# Patient Record
Sex: Male | Born: 1985 | Hispanic: No | Marital: Married | Smoking: Former smoker
Health system: Southern US, Community
[De-identification: ages and names within clinical notes are randomized; demographics above are authoritative.]

## PROBLEM LIST (undated history)

## (undated) ENCOUNTER — Ambulatory Visit (HOSPITAL_COMMUNITY): Admission: EM | Payer: Medicaid Other

## (undated) DIAGNOSIS — I10 Essential (primary) hypertension: Secondary | ICD-10-CM

## (undated) DIAGNOSIS — K219 Gastro-esophageal reflux disease without esophagitis: Secondary | ICD-10-CM

## (undated) HISTORY — DX: Gastro-esophageal reflux disease without esophagitis: K21.9

## (undated) HISTORY — DX: Essential (primary) hypertension: I10

## (undated) HISTORY — PX: UPPER GASTROINTESTINAL ENDOSCOPY: SHX188

---

## 2020-06-13 ENCOUNTER — Ambulatory Visit: Payer: Self-pay | Admitting: Nurse Practitioner

## 2020-07-22 ENCOUNTER — Encounter (HOSPITAL_COMMUNITY): Payer: Self-pay

## 2020-07-22 ENCOUNTER — Telehealth (HOSPITAL_COMMUNITY): Payer: Self-pay | Admitting: Emergency Medicine

## 2020-07-22 ENCOUNTER — Ambulatory Visit (HOSPITAL_COMMUNITY)
Admission: EM | Admit: 2020-07-22 | Discharge: 2020-07-22 | Disposition: A | Discharge status: Home / Self Care | Payer: Medicaid Other | Attending: Physician Assistant | Admitting: Physician Assistant

## 2020-07-22 DIAGNOSIS — M545 Low back pain, unspecified: Secondary | ICD-10-CM

## 2020-07-22 MED ORDER — IBUPROFEN 800 MG PO TABS: 800.0000 mg | ORAL_TABLET | Freq: Three times a day (TID) | ORAL | 0 refills | Status: DC | PRN

## 2020-07-22 MED ORDER — METHOCARBAMOL 500 MG PO TABS: 500.0000 mg | ORAL_TABLET | Freq: Four times a day (QID) | ORAL | 0 refills | Status: DC

## 2020-07-22 NOTE — ED Triage Notes (Signed)
Pt c/o severe back pain that radiates from neck to back, started for a long time, intermittent. Pain has gotten severe over time and makes it hard for patient to sleep. Reports no falls and no interventions taken.    Pain score 8/10, feels like "someone is cutting through my back".

## 2020-07-22 NOTE — Discharge Instructions (Addendum)
Schedule to see primary care for evaluation

## 2020-07-22 NOTE — ED Provider Notes (Signed)
[facility]    CSN: [id number] Arrival date & time: 07/22/20  2595      History   Chief Complaint Chief Complaint  Patient presents with   Back Pain   Neck Pain    HPI Shawn Stark is a 35 y.o. male.   The history is provided by the patient. No language interpreter was used.  Back Pain Location:  Generalized Quality:  Aching Radiates to:  Does not radiate Pain severity:  Moderate Onset quality:  Gradual Duration:  36 months (years) Timing:  Constant Progression:  Unchanged Context: not falling and not lifting heavy objects   Relieved by:  Nothing Worsened by:  Nothing Ineffective treatments:  None tried Associated symptoms: no abdominal pain and no numbness   Neck Pain Associated symptoms: no numbness    History reviewed. No pertinent past medical history.  There are no problems to display for this patient.   History reviewed. No pertinent surgical history.     Home Medications    Prior to Admission medications   Not on File    Family History Family History  Problem Relation Age of Onset   Healthy Mother    Healthy Father     Social History Social History   Tobacco Use   Smoking status: Every Day    Pack years: 0.00    Types: Cigarettes  Vaping Use   Vaping Use: Never used  Substance Use Topics   Alcohol use: Never   Drug use: Never     Allergies   Patient has no known allergies.   Review of Systems Review of Systems  Gastrointestinal:  Negative for abdominal pain.  Musculoskeletal:  Positive for back pain and neck pain.  Neurological:  Negative for numbness.  All other systems reviewed and are negative.   Physical Exam Triage Vital Signs ED Triage Vitals  Enc Vitals Group     BP 07/22/20 0853 (!) 132/94     Pulse Rate 07/22/20 0853 74     Resp 07/22/20 0853 18     Temp 07/22/20 0853 99.5 F (37.5 C)     Temp Source 07/22/20 0853 Oral     SpO2 07/22/20 0853 99 %     Weight --      Height --      Head  Circumference --      Peak Flow --      Pain Score 07/22/20 0850 8     Pain Loc --      Pain Edu? --      Excl. in GC? --    No data found.  Updated Vital Signs BP (!) 132/94 (BP Location: Left Arm)   Pulse 74   Temp 99.5 F (37.5 C) (Oral)   Resp 18   SpO2 99%   Visual Acuity Right Eye Distance:   Left Eye Distance:   Bilateral Distance:    Right Eye Near:   Left Eye Near:    Bilateral Near:     Physical Exam Vitals and nursing note reviewed.  Constitutional:      Appearance: He is well-developed.  HENT:     Head: Normocephalic and atraumatic.  Eyes:     Conjunctiva/sclera: Conjunctivae normal.  Cardiovascular:     Rate and Rhythm: Normal rate and regular rhythm.     Heart sounds: No murmur heard. Pulmonary:     Effort: Pulmonary effort is normal. No respiratory distress.     Breath sounds: Normal breath sounds.  Abdominal:  Palpations: Abdomen is soft.     Tenderness: There is no abdominal tenderness.  Musculoskeletal:        General: Normal range of motion.     Cervical back: Neck supple.  Skin:    General: Skin is warm and dry.  Neurological:     General: No focal deficit present.     Mental Status: He is alert and oriented to person, place, and time.     UC Treatments / Results  Labs (all labs ordered are listed, but only abnormal results are displayed) Labs Reviewed - No data to display  EKG   Radiology No results found.  Procedures Procedures (including critical care time)  Medications Ordered in UC Medications - No data to display  Initial Impression / Assessment and Plan / UC Course  I have reviewed the triage vital signs and the nursing notes.  Pertinent labs & imaging results that were available during my care of the patient were reviewed by me and considered in my medical decision making (see chart for details).     MDM;  Pt given rx for ibuprofen and robaxin  Pt given exercises.  Pt advised to follow up with primary care   Final Clinical Impressions(s) / UC Diagnoses   Final diagnoses:  Acute low back pain without sciatica, unspecified back pain laterality     Discharge Instructions      Schedule to see primary care for evaluation     ED Prescriptions     Medication Sig Dispense Auth. Provider   methocarbamol (ROBAXIN) 500 MG tablet Take 1 tablet (500 mg total) by mouth 4 (four) times daily. 20 tablet Jianni Shelden K, [place]   ibuprofen (ADVIL) 800 MG tablet Take 1 tablet (800 mg total) by mouth every 8 (eight) hours as needed. 30 tablet Elson Areas, [place]     An After Visit Summary was printed and given to the patient.  PDMP not reviewed this encounter.   Elson Areas, [place] 07/22/20 [phone]

## 2020-07-22 NOTE — Telephone Encounter (Signed)
Tried contacting pt via phone, unable to communicate due to language barrier. Pt left before registration.

## 2020-08-05 DIAGNOSIS — Z0289 Encounter for other administrative examinations: Secondary | ICD-10-CM

## 2020-08-05 HISTORY — DX: Encounter for other administrative examinations: Z02.89

## 2020-08-09 ENCOUNTER — Ambulatory Visit (HOSPITAL_COMMUNITY)
Admission: RE | Admit: 2020-08-09 | Discharge: 2020-08-09 | Disposition: A | Discharge status: Home / Self Care | Payer: Medicaid Other | Source: Ambulatory Visit | Attending: Family Medicine | Admitting: Family Medicine

## 2020-08-09 ENCOUNTER — Other Ambulatory Visit: Payer: Self-pay

## 2020-08-09 ENCOUNTER — Ambulatory Visit (INDEPENDENT_AMBULATORY_CARE_PROVIDER_SITE_OTHER): Payer: Medicaid Other | Admitting: Family Medicine

## 2020-08-09 ENCOUNTER — Other Ambulatory Visit (HOSPITAL_COMMUNITY)
Admission: RE | Admit: 2020-08-09 | Discharge: 2020-08-09 | Disposition: A | Discharge status: Home / Self Care | Payer: Medicaid Other | Source: Ambulatory Visit | Attending: Family Medicine | Admitting: Family Medicine

## 2020-08-09 VITALS — BP 118/84 | HR 76 | Ht 72.44 in | Wt 179.8 lb

## 2020-08-09 DIAGNOSIS — Z0289 Encounter for other administrative examinations: Secondary | ICD-10-CM | POA: Diagnosis not present

## 2020-08-09 DIAGNOSIS — Z87891 Personal history of nicotine dependence: Secondary | ICD-10-CM

## 2020-08-09 DIAGNOSIS — F17200 Nicotine dependence, unspecified, uncomplicated: Secondary | ICD-10-CM

## 2020-08-09 DIAGNOSIS — G8929 Other chronic pain: Secondary | ICD-10-CM

## 2020-08-09 DIAGNOSIS — M549 Dorsalgia, unspecified: Secondary | ICD-10-CM | POA: Insufficient documentation

## 2020-08-09 DIAGNOSIS — R0789 Other chest pain: Secondary | ICD-10-CM | POA: Insufficient documentation

## 2020-08-09 DIAGNOSIS — M545 Low back pain, unspecified: Secondary | ICD-10-CM | POA: Diagnosis not present

## 2020-08-09 DIAGNOSIS — B353 Tinea pedis: Secondary | ICD-10-CM | POA: Diagnosis not present

## 2020-08-09 DIAGNOSIS — K5900 Constipation, unspecified: Secondary | ICD-10-CM

## 2020-08-09 HISTORY — DX: Tinea pedis: B35.3

## 2020-08-09 HISTORY — DX: Constipation, unspecified: K59.00

## 2020-08-09 HISTORY — DX: Personal history of nicotine dependence: Z87.891

## 2020-08-09 LAB — POCT URINALYSIS DIP (MANUAL ENTRY)
Bilirubin, UA: NEGATIVE
Glucose, UA: NEGATIVE mg/dL
Ketones, POC UA: NEGATIVE mg/dL
Leukocytes, UA: NEGATIVE
Nitrite, UA: NEGATIVE
Protein Ur, POC: NEGATIVE mg/dL
Spec Grav, UA: 1.025 (ref 1.010–1.025)
Urobilinogen, UA: 0.2 E.U./dL
pH, UA: 6.5 (ref 5.0–8.0)

## 2020-08-09 MED ORDER — POLYETHYLENE GLYCOL 3350 17 GM/SCOOP PO POWD
17.0000 g | Freq: Every day | ORAL | 1 refills | Status: DC | PRN
Start: 2020-08-09 — End: 2020-10-04

## 2020-08-09 MED ORDER — NICOTINE 21 MG/24HR TD PT24: 21.0000 mg | MEDICATED_PATCH | Freq: Every day | TRANSDERMAL | 1 refills | Status: DC

## 2020-08-09 MED ORDER — CLOTRIMAZOLE 1 % EX CREA: 1.0000 "application " | TOPICAL_CREAM | Freq: Two times a day (BID) | CUTANEOUS | 0 refills | Status: DC

## 2020-08-09 MED ORDER — ACETAMINOPHEN 500 MG PO TABS: 500.0000 mg | ORAL_TABLET | Freq: Four times a day (QID) | ORAL | 1 refills | Status: DC | PRN

## 2020-08-09 NOTE — Assessment & Plan Note (Signed)
-   discussed importance of cessation, discussed options and he would like to try nicotine patch, prescribed today

## 2020-08-09 NOTE — Assessment & Plan Note (Signed)
-   initial screening lab work completed today - vaccine records and medical history updated today

## 2020-08-09 NOTE — Assessment & Plan Note (Signed)
-   clotrimazole cream to foot twice daily x 4 weeks

## 2020-08-09 NOTE — Assessment & Plan Note (Signed)
-   will get X-ray due to chronicity and subjective history of chills, lab work today as above, PRN apap and the muscle relaxer that he has left from the urgent care for pain, no red flag signs/symptoms

## 2020-08-09 NOTE — Assessment & Plan Note (Signed)
-   PRN Miralax daily, can increase up to two times per day if needed for a soft bowel movement daily

## 2020-08-09 NOTE — Patient Instructions (Addendum)
It was wonderful to see you today.  Please bring ALL of your medications with you to every visit.   Today we talked about:  - Welcome to our clinic! We have scheduled you for follow up in one month with your new PCP Dr Rachael Darby to follow up on the things we discussed today on September 19, 2000 - X-ray lumbar spine has been ordered, you can go across the street to the hospital to get this done, we will call you with the results - We have given you Miralax to take daily for constipation, tylenol as needed for your back pain, nicotine patches once daily to help you quit smoking, and clotrimazole cream for the itching between your toes - We did an EKG today which was normal, and we are scheduling you for an ECHO ultrasound of your heart - the best thing you can do for your heart is to quit smoking!    Thank you for choosing [facility].   Please call [phone] with any questions about today's appointment.  Please be sure to schedule follow up at the front  desk before you leave today.   Burley Saver, MD  Family Medicine

## 2020-08-09 NOTE — Progress Notes (Signed)
Patient Name: Shawn Stark Date of Birth: Feb 14, 1985 Date of Visit: 08/09/20 PCP: Katha Cabal, DO  Chief Complaint: refugee intake examination and intial visit   The patient's preferred language is Arabic. An interpreter was used for the entire visit.  Interpreter Name or ID: Bedor, in person interpretor    Subjective: Shawn Stark is a pleasant 35 y.o. presenting today for an initial refugee and immigrant clinic visit.   Chest pain-he notes a history of pain across his whole chest, sometimes happens twice daily, last occurred 3 days ago.  He notes this has been going on for 3 to 4 years, it will last 3 to 4 hours, and will feel "heavy on my chest," he has not tried any medications for it.  He does smoke 2 to 3 packs of cigarettes per day.  He notes this chest pain will start when he is carrying something heavy.  He notes that he can walk up 4 flights of stairs and can walk a long distance without having any chest pain.  When he has the chest pain he does not have shortness of breath, but sometimes he does feel sweaty.  No other symptoms can be elicited.  Knee pain-he notes for the past 2 to 3 years sometimes when he has pain in his back he will also have pain in his knees.  He denies any injury related to his knees.  He denies the knees swelling are turning red.  He has not tried any medication for his knees.  Low back pain-is to his lumbar spine and notes that the pain has back will sometimes wake him up at night feeling tight.  He notes this has been going on for 5 years.  He denies any inciting injury or accident, notes it is worse with heavy lifting.  He denies any saddle paresthesias, numbness, weakness, fevers.  He notes sometimes his legs feel weak.  He denies any bowel or bladder incontinence.  He went to urgent care a week ago and gave him the ibuprofen and muscle relaxer which she notes helps with the pain.  He denies any fevers but does note "chills" that he has had every day for  the past 1 to 1-1/2 years.  He has not had any testing for this.  Previous elevated blood pressure-on overseas exam blood pressure was noted to be 140/90, today blood pressure is within normal limits.  He denies any history of hypertension.  Denies any vision changes or headaches.  Itching of left toes-has been going on for 6 months, notes itching between the creases.  Has not tried anything for it  Pain on his pinky toe of right foot, has also been going on for 6 months, hurts when he wears tight shoes, no injury to his foot that he is aware of.  No redness or swelling.  ROS: no cough or difficulty breathing, no hemoptysis, no weight loss,  + chest pain after lifting (see above), no abdominal pain, no diarrhea, + constipation, no blood in stool, + subjective chills daily (noticed as shaking when he has pain), no fevers, denies vision changes or headaches, denies trouble with eating, does note some tooth pain  PMH: No past medical history No blood transfusions  PSH: No surgeries   FH: No family history of heart disease, diabetes, high cholesterol  Allergies:  NKDA   Current Medications:  Ibuprofen PRN Robaxin PRN   Social History: Tobacco Use: since age 38, ~ 20 years, 20-25 cigarettes (2-3 boxes), has quit  in the past- quit 1.5 years once Alcohol Use: None  In the past two weeks, have you run out of food before you had money to purchase more? None  In the past two weeks, have you had difficulty with obtaining food for your family? No  Refugee Information Number of Immediate Family Members: 2 Number of Immediate Family Members in [place]: 1 Date of Arrival: 04/28/20 Country of Birth: [place] Country of Origin: Other ([place]) Location of Refugee Camp:  ([place]) Duration in [place]: 6-10 years (9 years) Reason for Leaving Home Country: [place] Language: Arabic Able to Read in Primary Language: Yes Able to Write in Primary Language: No Education: [employer] (8th  grade) Prior Work: Set designer, farmer Marital Status: Married Tuberculosis Screening Overseas: Negative Tuberculosis Screening Health Department: Not Completed Health Department Labs Completed: No History of Trauma: None Do You Feel Jumpy or Nervous?: No Are You Very Watchful or 'Super Alert'?: No   Date of Overseas Exam: 03/13/2020 Review of Overseas Exam: Yes Pre-Departure Treatment: praziquantal, albendazole, ivermectin  [place]verseas Vaccines Reviewed and Updated in Epic Yes   Vitals:   08/09/20 0918  BP: 118/84  Pulse: 76  SpO2: 97%   HEENT: Sclera anicteric. Dentition is poor . Appears well hydrated. Neck: Supple Cardiac: Regular rate and rhythm. Normal S1/S2. No murmurs, rubs, or gallops appreciated. Lungs: Clear bilaterally to ascultation.  Abdomen: Normoactive bowel sounds. No tenderness to deep or light palpation. No rebound or guarding. Splenomegaly not appreciated on exam  Extremities: Warm, well perfused without edema.  Skin: scaling of skin of left foot in between toe folds, no bleeding or skin breakdown, thickened pinky toenail on right foot without skin breakdown, + callus on right lateral foot without skin breakdown  Psych: Pleasant and appropriate  MSK: strength 5/5 in bilateral lower extremities, sensation intact bilaterally, patellar DTRs 2+ bilaterally, full forward flexion of spine noted although he notes some pain with doing this  EKG read and interpreted by myself: NSR, no ST segment changes or TWI, incomplete RBBB, no Q waves, no ectopy  Constipation - PRN Miralax daily, can increase up to two times per day if needed for a soft bowel movement daily  Chronic midline low back pain without sciatica - will get X-ray due to chronicity and subjective history of chills, lab work today as above, PRN apap and the muscle relaxer that he has left from the urgent care for pain, no red flag signs/symptoms  Encounter for health examination of refugee - initial  screening lab work completed today - vaccine records and medical history updated today  Tobacco use disorder - discussed importance of cessation, discussed options and he would like to try nicotine patch, prescribed today  Atypical chest pain - this is noted mostly with lifting, he notes he can walk up four flights of stairs and walk a long distance without chest pain, thus making me much less suspicious for angina - EKG in office today without signs of ischemia - risk stratification labs including lipid panel and A1c ordered today, discussed smoking cessation as above - ECHO ordered to assess for structural heart defects, no murmur or abnormality appreciated on physical exam today - Discussed return precautions with patient, consider cardiology referral for stress test if pain changes or abnormalities noted on ECHO  Tinea pedis of left foot - clotrimazole cream to foot twice daily x 4 weeks  At follow up- - At end of visit mentioned occassional heartburn, consider H pylori testing and trial of H2  blocker or PPI  I have personally updated the history tabs within Epic and included the refugee information in social documentation.   Designated Market researcher signed with agency.   Release of information signed for Health Department.   Return to care in 1 month in [facility] with resident physician and PCP Dr Rachael Darby.   Vaccines: Updated in Epic

## 2020-08-09 NOTE — Assessment & Plan Note (Signed)
-   this is noted mostly with lifting, he notes he can walk up four flights of stairs and walk a long distance without chest pain, thus making me much less suspicious for angina - EKG in office today without signs of ischemia - risk stratification labs including lipid panel and A1c ordered today, discussed smoking cessation as above - ECHO ordered to assess for structural heart defects, no murmur or abnormality appreciated on physical exam today - Discussed return precautions with patient, consider cardiology referral for stress test if pain changes or abnormalities noted on ECHO

## 2020-08-10 LAB — SEDIMENTATION RATE: Sed Rate: 2 mm/hr (ref 0–15)

## 2020-08-10 LAB — URINE CYTOLOGY ANCILLARY ONLY
Chlamydia: NEGATIVE
Comment: NEGATIVE
Comment: NORMAL
Neisseria Gonorrhea: NEGATIVE

## 2020-08-10 LAB — HEMOGLOBIN A1C
Est. average glucose Bld gHb Est-mCnc: 100 mg/dL
Hgb A1c MFr Bld: 5.1 % (ref 4.8–5.6)

## 2020-08-14 ENCOUNTER — Telehealth: Payer: Self-pay | Admitting: Family Medicine

## 2020-08-14 DIAGNOSIS — R7612 Nonspecific reaction to cell mediated immunity measurement of gamma interferon antigen response without active tuberculosis: Secondary | ICD-10-CM

## 2020-08-14 LAB — QUANTIFERON-TB GOLD PLUS
QuantiFERON Mitogen Value: >10 IU/mL
QuantiFERON Nil Value: 0.49 IU/mL
QuantiFERON TB1 Ag Value: 1.89 IU/mL
QuantiFERON TB2 Ag Value: 3.48 IU/mL
QuantiFERON-TB Gold Plus: POSITIVE — AB

## 2020-08-14 LAB — HEPATITIS B SURFACE ANTIGEN: Hepatitis B Surface Ag: NEGATIVE

## 2020-08-14 LAB — CBC WITH DIFFERENTIAL/PLATELET
Basophils Absolute: 0 10*3/uL (ref 0.0–0.2)
Basos: 1 %
EOS (ABSOLUTE): 0.1 10*3/uL (ref 0.0–0.4)
Eos: 1 %
Hematocrit: 48.4 % (ref 37.5–51.0)
Hemoglobin: 16.3 g/dL (ref 13.0–17.7)
Immature Grans (Abs): 0 10*3/uL (ref 0.0–0.1)
Immature Granulocytes: 0 %
Lymphocytes Absolute: 2 10*3/uL (ref 0.7–3.1)
Lymphs: 47 %
MCH: 29.6 pg (ref 26.6–33.0)
MCHC: 33.7 g/dL (ref 31.5–35.7)
MCV: 88 fL (ref 79–97)
Monocytes Absolute: 0.4 10*3/uL (ref 0.1–0.9)
Monocytes: 9 %
Neutrophils Absolute: 1.8 10*3/uL (ref 1.4–7.0)
Neutrophils: 42 %
Platelets: 162 10*3/uL (ref 150–450)
RBC: 5.51 x10E6/uL (ref 4.14–5.80)
RDW: 13.9 % (ref 11.6–15.4)
WBC: 4.2 10*3/uL (ref 3.4–10.8)

## 2020-08-14 LAB — COMPREHENSIVE METABOLIC PANEL
ALT: 34 IU/L (ref 0–44)
AST: 24 IU/L (ref 0–40)
Albumin/Globulin Ratio: 1.8 (ref 1.2–2.2)
Albumin: 4.6 g/dL (ref 4.0–5.0)
Alkaline Phosphatase: 45 IU/L (ref 44–121)
BUN/Creatinine Ratio: 10 (ref 9–20)
BUN: 9 mg/dL (ref 6–20)
Bilirubin Total: 0.5 mg/dL (ref 0.0–1.2)
CO2: 23 mmol/L (ref 20–29)
Calcium: 9.9 mg/dL (ref 8.7–10.2)
Chloride: 100 mmol/L (ref 96–106)
Creatinine, Ser: 0.93 mg/dL (ref 0.76–1.27)
Globulin, Total: 2.5 g/dL (ref 1.5–4.5)
Glucose: 96 mg/dL (ref 65–99)
Potassium: 4.2 mmol/L (ref 3.5–5.2)
Sodium: 137 mmol/L (ref 134–144)
Total Protein: 7.1 g/dL (ref 6.0–8.5)
eGFR: 111 mL/min/{1.73_m2} (ref >59)

## 2020-08-14 LAB — LIPID PANEL
Chol/HDL Ratio: 6.9 ratio — ABNORMAL HIGH (ref 0.0–5.0)
Cholesterol, Total: 193 mg/dL (ref 100–199)
HDL: 28 mg/dL — ABNORMAL LOW (ref >39)
LDL Chol Calc (NIH): 81 mg/dL (ref 0–99)
Triglycerides: 519 mg/dL — ABNORMAL HIGH (ref 0–149)
VLDL Cholesterol Cal: 84 mg/dL — ABNORMAL HIGH (ref 5–40)

## 2020-08-14 LAB — HCV INTERPRETATION

## 2020-08-14 LAB — TSH: TSH: 1.5 u[IU]/mL (ref 0.450–4.500)

## 2020-08-14 LAB — HCV AB W REFLEX TO QUANT PCR: HCV Ab: 0.2 s/co ratio (ref 0.0–0.9)

## 2020-08-14 LAB — HIV ANTIBODY (ROUTINE TESTING W REFLEX): HIV Screen 4th Generation wRfx: NONREACTIVE

## 2020-08-14 LAB — RPR: RPR Ser Ql: NONREACTIVE

## 2020-08-14 LAB — HEPATITIS B CORE ANTIBODY, TOTAL: Hep B Core Total Ab: NEGATIVE

## 2020-08-14 LAB — VARICELLA ZOSTER ANTIBODY, IGG: Varicella zoster IgG: 2394 index (ref >165)

## 2020-08-14 LAB — HEPATITIS B SURFACE ANTIBODY, QUANTITATIVE: Hepatitis B Surf Ab Quant: 21.4 m[IU]/mL (ref >9.9)

## 2020-08-14 NOTE — Telephone Encounter (Signed)
Called with Arabic interpretor ID: [id number]. Confirmed I am speaking with Shawn Stark.   Explained that lab results normal except for Quant Gold which was positive. He denies fevers, chills, night sweats, dyspnea or coughing. Explained that we need to do a Chest X-ray to rule out pulmonary abnormality and then he can be treated for latent TB at the health department. Explained the difference between latent and pulmonary TB.  Explained that he would go to [facility] to get the chest X-ray, same place where he got his back X-ray. Chest X-ray ordered. Explained I will call him with the results.  Also explained lumbar spine X-ray without fracture or signs of arthritis.   Answered all questions and concerns.  Burley Saver MD

## 2020-08-18 ENCOUNTER — Other Ambulatory Visit: Payer: Self-pay

## 2020-08-18 ENCOUNTER — Other Ambulatory Visit: Payer: Self-pay | Admitting: Obstetrics and Gynecology

## 2020-08-18 ENCOUNTER — Ambulatory Visit
Admission: RE | Admit: 2020-08-18 | Discharge: 2020-08-18 | Disposition: A | Discharge status: Home / Self Care | Payer: No Typology Code available for payment source | Source: Ambulatory Visit | Attending: Obstetrics and Gynecology | Admitting: Obstetrics and Gynecology

## 2020-08-18 DIAGNOSIS — R7611 Nonspecific reaction to tuberculin skin test without active tuberculosis: Secondary | ICD-10-CM

## 2020-08-22 ENCOUNTER — Other Ambulatory Visit: Payer: Self-pay

## 2020-08-22 ENCOUNTER — Ambulatory Visit (HOSPITAL_COMMUNITY)
Admission: RE | Admit: 2020-08-22 | Discharge: 2020-08-22 | Disposition: A | Discharge status: Home / Self Care | Payer: Medicaid Other | Source: Ambulatory Visit | Attending: Family Medicine | Admitting: Family Medicine

## 2020-08-22 DIAGNOSIS — R0789 Other chest pain: Secondary | ICD-10-CM | POA: Insufficient documentation

## 2020-08-22 LAB — ECHOCARDIOGRAM COMPLETE
Area-P 1/2: 4.8 cm2
S' Lateral: 3 cm

## 2020-08-22 NOTE — Progress Notes (Signed)
  Echocardiogram 2D Echocardiogram has been performed.  Delcie Roch 08/22/2020, 2:35 PM

## 2020-09-19 ENCOUNTER — Ambulatory Visit: Payer: Self-pay | Admitting: Family Medicine

## 2020-09-20 ENCOUNTER — Emergency Department (HOSPITAL_COMMUNITY): Payer: Medicaid Other

## 2020-09-20 ENCOUNTER — Emergency Department (HOSPITAL_COMMUNITY)
Admission: EM | Admit: 2020-09-20 | Discharge: 2020-09-20 | Disposition: A | Discharge status: Home / Self Care | Payer: Medicaid Other | Attending: Emergency Medicine | Admitting: Emergency Medicine

## 2020-09-20 ENCOUNTER — Encounter (HOSPITAL_COMMUNITY): Payer: Self-pay | Admitting: Emergency Medicine

## 2020-09-20 DIAGNOSIS — R110 Nausea: Secondary | ICD-10-CM | POA: Insufficient documentation

## 2020-09-20 DIAGNOSIS — R1010 Upper abdominal pain, unspecified: Secondary | ICD-10-CM | POA: Diagnosis not present

## 2020-09-20 DIAGNOSIS — R109 Unspecified abdominal pain: Secondary | ICD-10-CM

## 2020-09-20 DIAGNOSIS — F17210 Nicotine dependence, cigarettes, uncomplicated: Secondary | ICD-10-CM | POA: Diagnosis not present

## 2020-09-20 LAB — COMPREHENSIVE METABOLIC PANEL
ALT: 30 U/L (ref 0–44)
AST: 25 U/L (ref 15–41)
Albumin: 4 g/dL (ref 3.5–5.0)
Alkaline Phosphatase: 35 U/L — ABNORMAL LOW (ref 38–126)
Anion gap: 8 (ref 5–15)
BUN: 16 mg/dL (ref 6–20)
CO2: 23 mmol/L (ref 22–32)
Calcium: 9.4 mg/dL (ref 8.9–10.3)
Chloride: 106 mmol/L (ref 98–111)
Creatinine, Ser: 0.92 mg/dL (ref 0.61–1.24)
GFR, Estimated: >60 mL/min (ref >60)
Glucose, Bld: 86 mg/dL (ref 70–99)
Potassium: 3.9 mmol/L (ref 3.5–5.1)
Sodium: 137 mmol/L (ref 135–145)
Total Bilirubin: 0.8 mg/dL (ref 0.3–1.2)
Total Protein: 6.9 g/dL (ref 6.5–8.1)

## 2020-09-20 LAB — CBC WITH DIFFERENTIAL/PLATELET
Abs Immature Granulocytes: 0.02 10*3/uL (ref 0.00–0.07)
Basophils Absolute: 0 10*3/uL (ref 0.0–0.1)
Basophils Relative: 1 %
Eosinophils Absolute: 0.1 10*3/uL (ref 0.0–0.5)
Eosinophils Relative: 1 %
HCT: 45.4 % (ref 39.0–52.0)
Hemoglobin: 16 g/dL (ref 13.0–17.0)
Immature Granulocytes: 1 %
Lymphocytes Relative: 46 %
Lymphs Abs: 2.1 10*3/uL (ref 0.7–4.0)
MCH: 29.9 pg (ref 26.0–34.0)
MCHC: 35.2 g/dL (ref 30.0–36.0)
MCV: 84.9 fL (ref 80.0–100.0)
Monocytes Absolute: 0.4 10*3/uL (ref 0.1–1.0)
Monocytes Relative: 10 %
Neutro Abs: 1.8 10*3/uL (ref 1.7–7.7)
Neutrophils Relative %: 41 %
Platelets: 169 10*3/uL (ref 150–400)
RBC: 5.35 MIL/uL (ref 4.22–5.81)
RDW: 12.3 % (ref 11.5–15.5)
WBC: 4.3 10*3/uL (ref 4.0–10.5)
nRBC: 0 % (ref 0.0–0.2)

## 2020-09-20 LAB — LIPASE, BLOOD: Lipase: 30 U/L (ref 11–51)

## 2020-09-20 LAB — TROPONIN I (HIGH SENSITIVITY)
Troponin I (High Sensitivity): 3 ng/L (ref <18)
Troponin I (High Sensitivity): 3 ng/L (ref <18)

## 2020-09-20 MED ORDER — PANTOPRAZOLE SODIUM 20 MG PO TBEC: 20.0000 mg | DELAYED_RELEASE_TABLET | Freq: Every day | ORAL | 0 refills | Status: DC

## 2020-09-20 MED ORDER — FAMOTIDINE IN NACL 20-0.9 MG/50ML-% IV SOLN
20.0000 mg | Freq: Once | INTRAVENOUS | Status: AC
  Administered 2020-09-20: 20 mg via INTRAVENOUS
  Filled 2020-09-20: qty 50

## 2020-09-20 MED ORDER — LIDOCAINE VISCOUS HCL 2 % MT SOLN
15.0000 mL | Freq: Once | OROMUCOSAL | Status: AC
  Administered 2020-09-20: 15 mL via ORAL
  Filled 2020-09-20: qty 15

## 2020-09-20 MED ORDER — ONDANSETRON HCL 4 MG/2ML IJ SOLN: 4.0000 mg | Freq: Once | INTRAMUSCULAR | Status: DC

## 2020-09-20 MED ORDER — ONDANSETRON 4 MG PO TBDP: 4.0000 mg | ORAL_TABLET | Freq: Three times a day (TID) | ORAL | 0 refills | Status: DC | PRN

## 2020-09-20 MED ORDER — SUCRALFATE 1 GM/10ML PO SUSP: 1.0000 g | Freq: Three times a day (TID) | ORAL | 0 refills | Status: DC

## 2020-09-20 MED ORDER — SODIUM CHLORIDE 0.9 % IV BOLUS
1000.0000 mL | Freq: Once | INTRAVENOUS | Status: AC
  Administered 2020-09-20: 1000 mL via INTRAVENOUS

## 2020-09-20 MED ORDER — ALUM & MAG HYDROXIDE-SIMETH 200-200-20 MG/5ML PO SUSP
30.0000 mL | Freq: Once | ORAL | Status: AC
  Administered 2020-09-20: 30 mL via ORAL
  Filled 2020-09-20: qty 30

## 2020-09-20 NOTE — Discharge Instructions (Addendum)
Your labs and ultrasound were reassuring.  We are sending you home with the following medications to help with your symptoms:  - Protonix- please take 1 tablet in the morning prior to any meals to help with stomach acidity/pain.  - Carafate- please take prior to each meal and prior to bedtime to help with stomach acidity/pain.  - Zofran- please take every 8 hours as needed for nausea/vomiting.   We have prescribed you new medication(s) today. Discuss the medications prescribed today with your pharmacist as they can have adverse effects and interactions with your other medicines including over the counter and prescribed medications. Seek medical evaluation if you start to experience new or abnormal symptoms after taking one of these medicines, seek care immediately if you start to experience difficulty breathing, feeling of your throat closing, facial swelling, or rash as these could be indications of a more serious allergic reaction  Please follow attached diet guidelines.   Follow up with your primary care provider and or GI as soon as possible for re-evaluation..  Return to the ER for new or worsening symptoms including but not limited to worsened pain, new pain, inability to keep fluids down, blood in vomit/stool, passing out, or any other concerns.

## 2020-09-20 NOTE — ED Notes (Signed)
Interpreter used and patient verbalizes understanding of discharge instructions. Prescriptions and follow-up care with the GI reviewed. Opportunity for questioning and answers were provided. Armband removed by staff, pt discharged from ED ambulatory.

## 2020-09-20 NOTE — ED Triage Notes (Signed)
Pt here from home  with c/o acid reflux that has been ongoing for 2 months , pt has not tried anything for it

## 2020-09-20 NOTE — ED Provider Notes (Signed)
[facility] EMERGENCY DEPARTMENT Provider Note   CSN: [id number] Arrival date & time: 09/20/20  3382     History Chief Complaint  Patient presents with   Abdominal Pain    Shawn Stark is a 35 y.o. male with a hx of tobacco use who presents to the ED with complaints of abdominal pain intermittently for years. Patient states he has had intermittent abdominal pain for a very long time that seems to be getting progressively worse, lasts longer periods of times and is occurring more frequently recently. Pain is located in the upper abdomen with some radiating into the chest, feels like a burning/tightness that is most prominent after eating something spicy/greasy, but can occur with drinking water as well. He states that he sometimes has the pain when exerting himself, but this does not specifically seem to trigger it or worsen it. He is able to exercise without the pain. He has associated nausea at times and does not intermittent constipation, passing gas without difficulty. Seen by PCP, had reassuring echo. Not currently taking any  medications for this. Denies fever, chills, hematemesis, melena, hematochezia, hematemesis, dysuria, or dyspnea.   Interpretor utilized throughout Audiological scientist.   HPI     History reviewed. No pertinent past medical history.  Patient Active Problem List   Diagnosis Date Noted   Atypical chest pain 08/09/2020   Chronic midline low back pain without sciatica 08/09/2020   Tinea pedis of left foot 08/09/2020   Tobacco use disorder 08/09/2020   Constipation 08/09/2020   Encounter for health examination of refugee 08/05/2020    History reviewed. No pertinent surgical history.     Family History  Problem Relation Age of Onset   Healthy Mother    Healthy Father     Social History   Tobacco Use   Smoking status: Every Day    Types: Cigarettes   Smokeless tobacco: Never  Vaping Use   Vaping Use: Never used  Substance Use Topics    Alcohol use: Never   Drug use: Never    Home Medications Prior to Admission medications   Medication Sig Start Date End Date Taking? Authorizing Provider  acetaminophen (TYLENOL) 500 MG tablet Take 1 tablet (500 mg total) by mouth every 6 (six) hours as needed. 08/09/20   Billey Co, MD  clotrimazole (LOTRIMIN) 1 % cream Apply 1 application topically 2 (two) times daily. For four weeks total 08/09/20   Billey Co, MD  ibuprofen (ADVIL) 800 MG tablet Take 1 tablet (800 mg total) by mouth every 8 (eight) hours as needed. 07/22/20   Elson Areas, PA-C  methocarbamol (ROBAXIN) 500 MG tablet Take 1 tablet (500 mg total) by mouth 4 (four) times daily. 07/22/20   Elson Areas, PA-C  nicotine (NICODERM CQ - DOSED IN MG/24 HOURS) 21 mg/24hr patch Place 1 patch (21 mg total) onto the skin daily. 08/09/20   Billey Co, MD  polyethylene glycol powder (GLYCOLAX/MIRALAX) 17 GM/SCOOP powder Take 17 g by mouth daily as needed for mild constipation. 08/09/20   Billey Co, MD    Allergies    Patient has no known allergies.  Review of Systems   Review of Systems  Constitutional:  Negative for chills and fever.  Respiratory:  Negative for shortness of breath.   Cardiovascular:  Positive for chest pain. Negative for leg swelling.  Gastrointestinal:  Positive for abdominal pain, constipation and nausea. Negative for anal bleeding, blood in stool, diarrhea and vomiting.  Genitourinary:  Negative for dysuria.  Neurological:  Negative for syncope.  All other systems reviewed and are negative.  Physical Exam Updated Vital Signs BP (!) 153/93   Pulse (!) 58   Temp (!) 97.3 F (36.3 C) (Oral)   Resp 15   SpO2 100%   Physical Exam Vitals and nursing note reviewed.  Constitutional:      General: He is not in acute distress.    Appearance: He is well-developed. He is not toxic-appearing.  HENT:     Head: Normocephalic and atraumatic.  Eyes:     General:        Right eye: No  discharge.        Left eye: No discharge.     Conjunctiva/sclera: Conjunctivae normal.  Cardiovascular:     Rate and Rhythm: Normal rate and regular rhythm.  Pulmonary:     Effort: Pulmonary effort is normal. No respiratory distress.     Breath sounds: Normal breath sounds. No wheezing, rhonchi or rales.  Abdominal:     General: There is no distension.     Palpations: Abdomen is soft.     Tenderness: There is abdominal tenderness in the right upper quadrant, epigastric area and left upper quadrant. There is no guarding or rebound. Negative signs include Murphy's sign and McBurney's sign.  Musculoskeletal:     Cervical back: Neck supple.  Skin:    General: Skin is warm and dry.     Findings: No rash.  Neurological:     Mental Status: He is alert.     Comments: Clear speech.   Psychiatric:        Behavior: Behavior normal.    ED Results / Procedures / Treatments   Labs (all labs ordered are listed, but only abnormal results are displayed) Labs Reviewed  COMPREHENSIVE METABOLIC PANEL - Abnormal; Notable for the following components:      Result Value   Alkaline Phosphatase 35 (*)    All other components within normal limits  LIPASE, BLOOD  CBC WITH DIFFERENTIAL/PLATELET  TROPONIN I (HIGH SENSITIVITY)  TROPONIN I (HIGH SENSITIVITY)    EKG None  Radiology DG Chest 2 View  Result Date: 09/20/2020 CLINICAL DATA:  35 year old male with intermittent epigastric/chest pain for the past 2 months. Postprandial pain. EXAM: CHEST - 2 VIEW COMPARISON:  Chest radiograph 08/18/2020. FINDINGS: Mildly lower lung volumes. Normal cardiac size and mediastinal contours. Visualized tracheal air column is within normal limits. Both lungs appear clear. No pneumothorax or pleural effusion. Negative visible bowel gas and osseous structures. IMPRESSION: Negative.  No cardiopulmonary abnormality. Electronically Signed   By: Odessa Fleming M.D.   On: 09/20/2020 10:21   US Abdomen Limited RUQ  (LIVER/GB)  Result Date: 09/20/2020 CLINICAL DATA:  RIGHT upper quadrant pain for 2 months in a 35 year old male. EXAM: ULTRASOUND ABDOMEN LIMITED RIGHT UPPER QUADRANT COMPARISON:  None. FINDINGS: Gallbladder: No gallstones or wall thickening visualized. No sonographic Murphy sign noted by sonographer. Common bile duct: Diameter: 2.2 mm Liver: No focal lesion identified. Within normal limits in parenchymal echogenicity. Portal vein is patent on color Doppler imaging with normal direction of blood flow towards the liver. Other: None. IMPRESSION: Unremarkable RIGHT upper quadrant sonogram. No signs of acute biliary process. Electronically Signed   By: Donzetta Kohut M.D.   On: 09/20/2020 17:02    Procedures Procedures   Medications Ordered in ED Medications  ondansetron (ZOFRAN) injection 4 mg (4 mg Intravenous Not Given 09/20/20 1729)  sodium chloride 0.9 % bolus  1,000 mL (0 mLs Intravenous Stopped 09/20/20 1955)  famotidine (PEPCID) IVPB 20 mg premix (0 mg Intravenous Stopped 09/20/20 1725)  alum & mag hydroxide-simeth (MAALOX/MYLANTA) 200-200-20 MG/5ML suspension 30 mL (30 mLs Oral Given 09/20/20 1656)    And  lidocaine (XYLOCAINE) 2 % viscous mouth solution 15 mL (15 mLs Oral Given 09/20/20 1656)    ED Course  I have reviewed the triage vital signs and the nursing notes.  Pertinent labs & imaging results that were available during my care of the patient were reviewed by me and considered in my medical decision making (see chart for details).    MDM Rules/Calculators/A&P                           Patient presents to the ED with complaints of abdominal pain intermittently for years. Nontoxic, vitals w/ mildly elevated BP. Mild upper abdominal tenderness on exam without peritoneal signs.   Additional history obtained:  Additional history obtained from chart review & nursing note review.  08/22/20: Echocardiogram: EF 60-65%, normal diastolic parameters.   Lab Tests:  I reviewed and  interpreted labs, which included:  CBC, CMP, lipase, troponin: Unremarkable.   Imaging Studies ordered:  CXR ordered in triage, I ordered RUQ [place], I independently reviewed, formal radiology impression shows:  Unremarkable RIGHT upper quadrant sonogram. No signs of acute biliary process  ED Course:  EKG without acute ischemia, troponins are flat, patient able to exercise without symptoms, feel that ACS is less likely.  Right upper quadrant ultrasound without findings of cholelithiasis, cholecystitis, or choledocholithiasis.  Lipase is within normal limits making pancreatitis less likely.  Repeat abdominal exam remains without peritoneal signs, do not suspect acute surgical abdomen.  Patient feeling improved status post GI cocktail and Pepcid in the emergency department.  Unclear definitive etiology, possibly GERD versus PUD, will provide supportive care with PCP follow-up. I discussed results, treatment plan, need for follow-up, and return precautions with the patient. Provided opportunity for questions, patient confirmed understanding and is in agreement with plan.   Portions of this note were generated with Scientist, clinical (histocompatibility and immunogenetics). Dictation errors may occur despite best attempts at proofreading.  Final Clinical Impression(s) / ED Diagnoses Final diagnoses:  Abdominal pain    Rx / DC Orders ED Discharge Orders          Ordered    pantoprazole (PROTONIX) 20 MG tablet  Daily        09/20/20 2005    sucralfate (CARAFATE) 1 GM/10ML suspension  3 times daily with meals & bedtime        09/20/20 2005    ondansetron (ZOFRAN ODT) 4 MG disintegrating tablet  Every 8 hours PRN        09/20/20 [address] 09/20/20 2007    Gerhard Munch, MD 09/21/20 2349

## 2020-09-20 NOTE — ED Provider Notes (Signed)
Emergency Medicine Provider Triage Evaluation Note  Shawn Stark , a 35 y.o. male  was evaluated in triage.  Pt complains of intermittent epigastric/chest pain for the past 2 months. Pt reports pain is worse after eating and describes it as a burning sensation. + nausea, no SOB or vomiting. Seen by family medicine last month for same however at that point described the pain as worse with exertion/lifting. EKG in office without ischemic changes and outpatient ECHO normal. Pt reports pain is more severe now prompting ED visit. He does not take anything for reflux.   Review of Systems  Positive: + epigastric/chest pain Negative: - SOB, vomiting, diaphoresis  Physical Exam  BP (!) 148/93 (BP Location: Left Arm)   Pulse 61   Temp (!) 97.3 F (36.3 C) (Oral)   Resp 14   SpO2 100%  Gen:   Awake, no distress   Resp:  Normal effort  MSK:   Moves extremities without difficulty  Other:  + epigastric TTP  Medical Decision Making  Medically screening exam initiated at 9:39 AM.  Appropriate orders placed.  Audrea Muscat was informed that the remainder of the evaluation will be completed by another provider, this initial triage assessment does not replace that evaluation, and the importance of remaining in the ED until their evaluation is complete.  Suspect GERD however labs, EKG, and CXR ordered at this time.    Tanda Rockers, PA-C 09/20/20 9528    Rolan Bucco, MD 09/20/20 1101

## 2020-09-29 NOTE — Progress Notes (Signed)
Chief Complaint: Abdominal pain  Due to language barrier, an interpreter was present during the history-taking and subsequent discussion (and for part of the physical exam) with this patient.  HPI :  35 year old [place] male w/hx of tobacco use presents for abdominal pain. The ab pain feels like a 10/10 in severity, is located in the epigastric area, radiates up the chest, worsened over time, and feels like a "cutting sensation." This ab pain has been an ongoing issue for at least the last 2 years. Fatty foods make the pain worse. Sometimes the pain wakes him up at night. Protonix has helped with some of ab pain but does not take it away completely. He takes his Protonix 20 mg after dinner before he goes to bed. He is also taking sucralfate every 3 hours, which helps somewhat with his symptoms. Denies dysphagia. Endorses constipation and black stools. The black stools have been present for a week. Denies iron supplement or Pepto Bismol use. Denies family history of GI cancers or GI diseases. Denies weight loss. On average he is having one stool every 2-3 days. He has been taking Miralax 2-3 times per day to try to induce a bowel movement. Denies fiber supplement use. Denies prior EGD or colonoscopy. Denies alcohol. Smokes 14-15 cigarettes per day.   Patient was seen in ED on 09/20/20 for chest pain/epigastric ab pain that worsens after eating and felt like a burning sensation. Work up including CBC, LFTs, lipase, troponin, RUQ U/S, CXR, and ECG were done at that time that were normal. He was given GI cocktail and Pepcid in the ED, and was prescribed Protonix 20 mg QD, carafate, and Zofran upon discharge. Also recently had TTE performed by PCP on 08/22/20 that was normal.   History reviewed. No pertinent past medical history.   History reviewed. No pertinent surgical history. Family History  Problem Relation Age of Onset   Healthy Mother    Healthy Father    Social History   Tobacco Use    Smoking status: Every Day    Types: Cigarettes   Smokeless tobacco: Never  Vaping Use   Vaping Use: Never used  Substance Use Topics   Alcohol use: Never   Drug use: Never   Current Outpatient Medications  Medication Sig Dispense Refill   ondansetron (ZOFRAN ODT) 4 MG disintegrating tablet Take 1 tablet (4 mg total) by mouth every 8 (eight) hours as needed for nausea or vomiting. 10 tablet 0   pantoprazole (PROTONIX) 20 MG tablet Take 1 tablet (20 mg total) by mouth daily. 30 tablet 0   sucralfate (CARAFATE) 1 GM/10ML suspension Take 10 mLs (1 g total) by mouth 4 (four) times daily -  with meals and at bedtime. 420 mL 0   No current facility-administered medications for this visit.   No Known Allergies   Review of Systems: All systems reviewed and negative except where noted in HPI.   Physical Exam: BP 134/88   Pulse 77   Ht 6\' 4"  (1.93 m)   Wt 182 lb (82.6 kg)   SpO2 98%   BMI 22.15 kg/m  Constitutional: Pleasant,well-developed, male in no acute distress. HEENT: Normocephalic and atraumatic. No scleral icterus. Cardiovascular: Normal rate, regular rhythm.  Pulmonary/chest: Effort normal and breath sounds normal. No wheezing, rales or rhonchi. Abdominal: Soft, nondistended, tender in the epigastric area. Bowel sounds active throughout.  Extremities: No edema Neurological: Alert and oriented to person place and time. Psychiatric: Normal mood and affect. Behavior is normal.  TTE 08/22/20: IMPRESSIONS   1. Left ventricular ejection fraction, by estimation, is 60 to 65%. The  left ventricle has normal function. The left ventricle has no regional  wall motion abnormalities. Left ventricular diastolic parameters were  normal.   2. Right ventricular systolic function is normal. The right ventricular  size is normal.   3. The mitral valve is grossly normal. Trivial mitral valve  regurgitation.   4. The aortic valve is tricuspid. Aortic valve regurgitation is not  visualized.    DG Chest 2 View 09/20/2020 CLINICAL DATA:  35 year old male with intermittent epigastric/chest pain for the past 2 months. Postprandial pain. EXAM: CHEST - 2 VIEW COMPARISON:  Chest radiograph 08/18/2020. FINDINGS: Mildly lower lung volumes. Normal cardiac size and mediastinal contours. Visualized tracheal air column is within normal limits. Both lungs appear clear. No pneumothorax or pleural effusion. Negative visible bowel gas and osseous structures. IMPRESSION: Negative.  No cardiopulmonary abnormality. Electronically Signed   By: Odessa Fleming M.D.   On: 09/20/2020 10:21   US Abdomen Limited RUQ (LIVER/GB) 09/20/2020 CLINICAL DATA:  RIGHT upper quadrant pain for 2 months in a 35 year old male. EXAM: ULTRASOUND ABDOMEN LIMITED RIGHT UPPER QUADRANT COMPARISON:  None. FINDINGS: Gallbladder: No gallstones or wall thickening visualized. No sonographic Murphy sign noted by sonographer. Common bile duct: Diameter: 2.2 mm Liver: No focal lesion identified. Within normal limits in parenchymal echogenicity. Portal vein is patent on color Doppler imaging with normal direction of blood flow towards the liver. Other: None. IMPRESSION: Unremarkable RIGHT upper quadrant sonogram. No signs of acute biliary process. Electronically Signed   By: Donzetta Kohut M.D.   On: 09/20/2020 17:02     ASSESSMENT AND PLAN:  Epigastric abdominal pain GERD Melena Suspect that his ab pain is due to GERD at this time. Had recent cardiac work up and RUQ U/S with labs including CBC and CMP that were unremarkable. Patient was started on a PPI recently with some improvement but was taking his PPI suboptimally (before bedtime after dinner). Will also plan for EGD to evaluate for alternative causes of ab pain such as H pylori and check for a source of melena - Increased Protonix from 20 mg QD to 40 mg BID, takes 30-60 min before meals - Counseled on avoiding certain foods such as spicy, caffeinated, fatty, citrus, alcohol -  EGD  Constipation. Recent issue over the last week. - Start fiber supplements - Continue Miralax 2-3 times per day  Eulah Pont, MD

## 2020-10-04 ENCOUNTER — Encounter: Payer: Self-pay | Admitting: Internal Medicine

## 2020-10-04 ENCOUNTER — Ambulatory Visit (INDEPENDENT_AMBULATORY_CARE_PROVIDER_SITE_OTHER): Payer: Medicaid Other | Admitting: Internal Medicine

## 2020-10-04 DIAGNOSIS — K5900 Constipation, unspecified: Secondary | ICD-10-CM

## 2020-10-04 DIAGNOSIS — K219 Gastro-esophageal reflux disease without esophagitis: Secondary | ICD-10-CM | POA: Diagnosis not present

## 2020-10-04 DIAGNOSIS — K921 Melena: Secondary | ICD-10-CM | POA: Diagnosis not present

## 2020-10-04 DIAGNOSIS — R1013 Epigastric pain: Secondary | ICD-10-CM

## 2020-10-04 MED ORDER — PANTOPRAZOLE SODIUM 40 MG PO TBEC
40.0000 mg | DELAYED_RELEASE_TABLET | Freq: Two times a day (BID) | ORAL | 3 refills | Status: DC
Start: 2020-10-04 — End: 2021-02-27

## 2020-10-04 MED ORDER — BENEFIBER PO POWD: 1.0000 | Freq: Every day | ORAL | 0 refills | Status: DC

## 2020-10-04 MED ORDER — PANTOPRAZOLE SODIUM 40 MG PO TBEC: 40.0000 mg | DELAYED_RELEASE_TABLET | Freq: Two times a day (BID) | ORAL | 3 refills | Status: DC

## 2020-10-04 NOTE — Patient Instructions (Addendum)
If you are age 34 or younger, your body mass index should be between 19-25. Your Body mass index is 22.15 kg/m. If this is out of the aformentioned range listed, please consider follow up with your Primary Care Provider.   The [facility] [place] [facility] providers would like to encourage you to use [facility] to communicate with providers for non-urgent requests or questions.  Due to long hold times on the telephone, sending your provider a message by [facility] may be a faster and more efficient way to get a response.  Please allow 48 business hours for a response.  Please remember that this is for non-urgent requests.   You have been scheduled for an endoscopy. Please follow written instructions given to you at your visit today. If you use inhalers (even only as needed), please bring them with you on the day of your procedure.  Due to recent changes in healthcare laws, you may see the results of your imaging and laboratory studies on MyChart before your provider has had a chance to review them.  We understand that in some cases there may be results that are confusing or concerning to you. Not all laboratory results come back in the same time frame and the provider may be waiting for multiple results in order to interpret others.  Please give [place] 48 hours in order for your provider to thoroughly review all the results before contacting the office for clarification of your results.   Please take pantoprazole 30 to 60 minutes before breakfast and dinner meal.  We have sent the following medications to your pharmacy for you to pick up at your convenience:  INCREASE: pantoprazole to 40mg  one tablet twice daily 30 to 60 minutes before meals.    Please purchase the following medications over the counter and take as directed:  START: Benefiber daily.  Thank you for entrusting me with your care and choosing [facility].  Dr [facility]  ??? ??? ???? 65 ????? ?? ??? ? ???? ?? ???? ???? ???? ???? ??? 19-25. ???? ????  ????? ?? 22.15 ??? / ?. ??? ??? ??? ???? ?????? ??????? ????? ? ????? ??????? ?? ???????? ?? ???? ??????? ??????? ????? ??.  ??? ????? [facility] ?????? ??? ??????? MYCHART ??????? ?? ????? ?????? ??????? ?? ??????? ??? ???????. ????? ?????? ???????? ??????? ??? ?????? ? ?? ???? ????? ????? ??? ????? ?? ???? MYCHART ????? ???? ????? ?????? ?????? ??? ???????. ?????? ?????? ? 48 ???? ??? ????. ???? ???? ?? ??? ??????? ??? ???????.  ??? ?? ????? ???? ?????? ??????? ???????. ???? ????? ????????? ???????? ???? ????? ?? ?? ?????? ?????. ??? ??? ?????? ????? ????????? (??? ??? ?????? ???) ? ????? ??????? ??? ?? ??? ???????.  ????? ????????? ??????? ?? ?????? ??????? ?????? ? ?? ??? ????? ??????? ????????? ????????? ??? MyChart ??? ?? ???? ?????? ?????? ?????????. ??? ????? ??? ?? ??? ??????? ?? ???? ???? ????? ????? ?? ???? ????. ?? ???? ???? ??????? ???????? ?? ??? ?????? ?????? ??? ????? ?????? ????? ?????? ?? ??? ????? ????? ????. ???? ????? 48 ???? ??? ????? ???? ?????? ????? ?? ?? ?????? ???? ??????? ???? ??? ??????? ??????? ?????? ??????.  ???? ????? ???? ??????????? ??? 30 ??? 60 ????? ?? ???? ??????? ???????.  ??? ?????? ??????? ??????? ??? ???????? ?????? ?? ???????? ?? ????? ???? ??????:  ???????: ??????????? ??? 40 ??? ??? ???? ????? ?????? ??? ??????? ?????? 30 ??? 60 ?????.  ???? ???? ??????? ??????? ???? ???? ???? ???????? ??? ?????????:  ????: Benefiber ??????.  ????? ?? ??? ?????? ??????? ???????  [facility]  ????? ????? 'iidha kan eumuruk 21 eaman 'aw 'aqala , fayajib 'an yakun muashir kutlat jismak bayn 19-25. muashir kutlat aljism hu 22.15 kajm / mi. 'iidha kan hadha kharij alnitaq almadhkur 'aelah , fayurjaa altafkir fi almutabaeat mae maqadam alrieayat al'awaliat [employer]. yawadu mufiru [facility] tashjieak ealaa astikhdam MYCHART liltawasul mae muqadimay alkhidmat liltalabat 'aw al'asyilat ghayr aleajilati. nzran lifatarat alaintizar altawilat ealaa alhatif , qad  yakun 'iirsal risalat 'iilaa muzawidik ean tariq [facility] tariqatan 'asrae wa'akthar faeiliatan lilhusul ealaa aistijabati. alraja' alsamah b 48 saeat eamal lilrad. yurjaa tadhakur 'ana hadha liltalabat ghayr aleajilati. laqad tama tahdid maweid li'iijra' altanzir aldaakhili. yurjaa aitibae altaelimat almaktubat alati 'uetiat lak fi ziaratik alyawma. 'iidha kunt tastakhdim 'ajhizat aliastinshaq (hataa eind alhajat faqat) , fayurjaa 'iihdaruha maeak fi yawm al'iijra'i. nzran liltaghyirat al'akhirat fi qawanin alrieayat alsihiyat , qad taraa natayij dirasatik altaswiriat walmukhbariat ealaa MyChart qabl 'an tutah alfursat limuzawidik limurajieatiha. nahn natafaham 'anah fi baed alhalat qad takun hunak natayij murbikat 'aw tuthir qalaqaka. la taeud jamie alnatayij almukhbiriat fi nafs al'iitar alzamanii waqad yuntazir almuzawad natayij mutaeadidatan min 'ajl tafsir natayij 'ukhraa. yurjaa manhuna 48 saeatan hataa yatamakan muzawid alkhidmat alkhasi bik min murajaeat jamie alnatayij bidiqat qabl alaitisal bialmaktab litawdih natayijika. yurjaa tanawul eaqar bantubrazul qabl 30 'iilaa 60 daqiqat min wajbat al'iiftar waleasha'i. laqad 'arsalna al'adwiat altaaliat 'iilaa alsaydaliat alkhasat bik litastalimaha fi alwaqt aladhi yunasibuku: alziyadatu: bantubrazul 'iilaa 40 majami qurs wahid maratayn ywmyan qabl alwajabat bihawalay 30 'iilaa 60 daqiqatin. yurjaa shira' al'adwiat altaaliat bidun wasfat tibiyat watanawuliha hasab altawjihati: abda: Benefiber ywmyan. shkran lak ealaa taklifik birieayatik [facility] duktur dursi

## 2020-10-11 NOTE — Progress Notes (Signed)
   SUBJECTIVE:   CHIEF COMPLAINT / HPI:   Chief Complaint  Patient presents with   Follow-up    Neck and Back pain      Shawn Stark is a 35 y.o. male here for back pain.    Back pain Reports intermittent low and mid back pain described "as if someone is cutting his back." Worse with raising his standing or sleeping for long periods of time. Pain radiates to neck. Tried nothing for pain. Pain starts in his neck and shoots to mid back. Has known chronic low back pain. He works at a [employer], fashion/clothing. Lifts  heavy objects (typically about 25 lbs) overhead throughout the day. Denies arm numbness, tingling, weakness,  recent trauma. No bowel or bladder incontinence, saddle paresthesia, fever, night sweats, dysuria.       PERTINENT  PMH / PSH: reviewed and updated as appropriate   OBJECTIVE:   BP (!) 146/88   Pulse 97   Ht 6\' 4"  (1.93 m)   Wt 184 lb (83.5 kg)   SpO2 100%   BMI 22.40 kg/m    GEN: pleasant well appearing male, in no acute distress  CV: normal rate and rhythm, radial and DP pulses equal and intact RESP: no increased work of breathing, clear to ascultation bilaterally MSK: no edema, or calf tenderness SKIN: warm, dry, no overlying skin changes on torso NEURO: no C, T or L spine tenderness Spine: - Inspection: no gross deformity or asymmetry, swelling or ecchymosis. No skin changes - Palpation: No TTP over the spinous processes, paraspinal muscles, or SI joints b/l, hypertrophy of paraspinal muscle of T and L spine  - ROM: full active ROM of the lumbar spine in flexion and extension, pain with extension - Strength: 5/5 strength of lower extremity in L4-S1 nerve root distributions b/l - Neuro: sensation intact in the L4-S1 nerve root distribution b/l, 2+ L4 and S1 reflexes - Special testing: Negative straight leg raise    ASSESSMENT/PLAN:   Chronic back pain Uncontrolled. Reviewed lumbar xray from July that showed sacral vertebrae. Discussed with patient  relative rest. Light duty at work for the next 2 weeks. He is agreeable. Work note provided. Pt to continue ordinary activities within the limits permitted by pain. Will prescribe Naproxen sodium and muscle relaxer for pain relief.  Advised patient to avoid other NSAIDs while taking Naprosyn medication. Counseled patient on red flag symptoms and when to seek immediate care. No red flags suggesting cauda equina syndrome or progressive major motor weakness. Patient to return if symptoms do not improve with conservative treatment.  Consider repeat imaging at that time.     Positive QuantiFERON-TB Gold test Advised pt to get CXR previously ordered at his refugee exam. HD previously notified. 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         August, DO PGY-3, [facility] 10/12/2020

## 2020-10-12 ENCOUNTER — Encounter: Payer: Self-pay | Admitting: Family Medicine

## 2020-10-12 ENCOUNTER — Other Ambulatory Visit: Payer: Self-pay

## 2020-10-12 ENCOUNTER — Ambulatory Visit (HOSPITAL_COMMUNITY)
Admission: RE | Admit: 2020-10-12 | Discharge: 2020-10-12 | Disposition: A | Discharge status: Home / Self Care | Payer: Medicaid Other | Source: Ambulatory Visit | Attending: Family Medicine | Admitting: Family Medicine

## 2020-10-12 ENCOUNTER — Ambulatory Visit (INDEPENDENT_AMBULATORY_CARE_PROVIDER_SITE_OTHER): Payer: Medicaid Other | Admitting: Family Medicine

## 2020-10-12 VITALS — BP 146/88 | HR 97 | Ht 76.0 in | Wt 184.0 lb

## 2020-10-12 DIAGNOSIS — R7612 Nonspecific reaction to cell mediated immunity measurement of gamma interferon antigen response without active tuberculosis: Secondary | ICD-10-CM | POA: Insufficient documentation

## 2020-10-12 DIAGNOSIS — G8929 Other chronic pain: Secondary | ICD-10-CM

## 2020-10-12 DIAGNOSIS — M549 Dorsalgia, unspecified: Secondary | ICD-10-CM | POA: Diagnosis not present

## 2020-10-12 MED ORDER — METHOCARBAMOL 500 MG PO TABS: 500.0000 mg | ORAL_TABLET | Freq: Three times a day (TID) | ORAL | 0 refills | Status: DC | PRN

## 2020-10-12 MED ORDER — NAPROXEN 500 MG PO TABS
500.0000 mg | ORAL_TABLET | Freq: Two times a day (BID) | ORAL | 0 refills | Status: DC
Start: 2020-10-12 — End: 2020-11-30

## 2020-10-12 NOTE — Patient Instructions (Addendum)
It was great seeing you today!   As discusssed, stop the pharmacy to pick up your muscle relaxer and pain medication. For your back pain, Take 1500 mg Tylenol twice a day, take Robaxin up to three times a day.    I'd like to see you back 2 weeks if your pain does not improve but if you need to be seen earlier than that for any new issues we're happy to fit you in, just give [place] a call!  Go to the radiology department in the hospital to get your xray.    Take care,   [facility]    ??? ?? ?????? ????? ?????!  ??? ??? ??????? ? ???? ?? ???????? ????? ???? ??????? ?????? ?????. ???? ???? ? ?? 1500 ??? ?? ???????? ????? ?? ????? ? ?????? ???????? ??? ???? ???? ?? ?????.  ??? ?? ???? ??? ??????? ??? ?? ????? ???? ???? ??? ??? ????? ??? ?? ??? ????? ?? ??? ???? ?? ??? ??????? ??? ?????? ????? ?????? ?? ?????? ???? ? ??? ???? ??? ??????? ???!  ???? ??? ??? ?????? ?? ???????? ?????? ??? ?????? ??????? ?????? ??.  ??????  ???? ??? ??? ??????

## 2020-10-14 DIAGNOSIS — R7612 Nonspecific reaction to cell mediated immunity measurement of gamma interferon antigen response without active tuberculosis: Secondary | ICD-10-CM

## 2020-10-14 HISTORY — DX: Nonspecific reaction to cell mediated immunity measurement of gamma interferon antigen response without active tuberculosis: R76.12

## 2020-10-14 NOTE — Assessment & Plan Note (Signed)
Uncontrolled. Reviewed lumbar xray from July that showed sacral vertebrae. Discussed with patient relative rest. Light duty at work for the next 2 weeks. He is agreeable. Work note provided. Pt to continue ordinary activities within the limits permitted by pain. Will prescribe Naproxen sodium and muscle relaxer for pain relief.  Advised patient to avoid other NSAIDs while taking Naprosyn medication. Counseled patient on red flag symptoms and when to seek immediate care. No red flags suggesting cauda equina syndrome or progressive major motor weakness. Patient to return if symptoms do not improve with conservative treatment.  Consider repeat imaging at that time.

## 2020-10-14 NOTE — Assessment & Plan Note (Signed)
Advised pt to get CXR previously ordered at his refugee exam. HD previously notified. 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

## 2020-10-18 ENCOUNTER — Encounter: Payer: Self-pay | Admitting: Certified Registered Nurse Anesthetist

## 2020-10-18 NOTE — Progress Notes (Signed)
My apologies, I thought that message to the health department was to include that he needs to be treated for latent TB.

## 2020-10-19 ENCOUNTER — Other Ambulatory Visit: Payer: Self-pay

## 2020-10-19 ENCOUNTER — Ambulatory Visit: Payer: Medicaid Other | Admitting: Internal Medicine

## 2020-10-19 VITALS — Temp 98.4°F

## 2020-10-19 DIAGNOSIS — R1013 Epigastric pain: Secondary | ICD-10-CM

## 2020-10-19 DIAGNOSIS — K219 Gastro-esophageal reflux disease without esophagitis: Secondary | ICD-10-CM

## 2020-10-19 MED ORDER — SODIUM CHLORIDE 0.9 % IV SOLN: 500.0000 mL | Freq: Once | INTRAVENOUS | Status: DC

## 2020-10-19 NOTE — Progress Notes (Signed)
VS- [employer] used today at the [employer] for this pt.  Interpreter's name is- Shawn Stark  Pt states he is currently being treated for TB, unsure of the name of medication.  Per OV and CXR with Dr. Rachael Darby on 10-12-20 pt is being treated for latent TB.  Pt states he has taken "about half of the prescribed medication."  Shawn Merles CRNA made aware.  Per anesthesia, pt needs to have procedure done after medication for TB is completed.    Pt is dressed, waiting to speak with Shawn Stark before he leaves.

## 2020-10-19 NOTE — Progress Notes (Signed)
Spoke with the patient with the assistance of an Arabic interpreter. He has been on latent TB treatment for about a month and will complete the rest of his treatment in about 5 months. As a result, his EGD will have to be delayed until he has completed his therapy. He has been doing well on PPI therapy. Abdominal pain has improved, and he has not seen any additional episodes of melena. We will reschedule his EGD to about 6-7 months from now. He can contact my clinic for a follow up visit if he has any additional issues in the interim.

## 2020-11-02 ENCOUNTER — Ambulatory Visit (INDEPENDENT_AMBULATORY_CARE_PROVIDER_SITE_OTHER): Payer: Medicaid Other | Admitting: Family Medicine

## 2020-11-02 ENCOUNTER — Encounter: Payer: Self-pay | Admitting: Family Medicine

## 2020-11-02 ENCOUNTER — Other Ambulatory Visit: Payer: Self-pay

## 2020-11-02 DIAGNOSIS — G8929 Other chronic pain: Secondary | ICD-10-CM | POA: Diagnosis not present

## 2020-11-02 DIAGNOSIS — M549 Dorsalgia, unspecified: Secondary | ICD-10-CM | POA: Diagnosis not present

## 2020-11-02 NOTE — Progress Notes (Signed)
   SUBJECTIVE:   CHIEF COMPLAINT / HPI:   Chief Complaint  Patient presents with   Back Pain   An Arabic interpreter was used: Name MAGED, ID number [id number]  Shawn Stark is a 35 y.o. male here for chronic back pain follow-up. He reports when he sits he gets severe lower back pain. Pain also in the knees. Has been taking muscle relaxer once a day and Naprosyn once a day since last visit. 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 Only been lifting about 8 lbs at work from previous 20-50 lb loads.    Patient denies bowel or bladder incontinence, saddle paresthesia, dysuria, fever, extremity weakness.  Denies recent trauma.   PERTINENT  PMH / PSH: reviewed and updated as appropriate   OBJECTIVE:   BP (!) 136/95   Pulse 68   Ht 6\' 4"  (1.93 m)   Wt 181 lb 2 oz (82.2 kg)   SpO2 100%   BMI 22.05 kg/m    General: Sitting comfortably, well developed male in no acute distress Cardiovascular: Radial and DP pulses intact, no abdominal bruit Respiratory: No increased work of breathing Musculoskeletal: No C, T midline tenderness.  Does it listed L4-L5 midline tenderness extending to the buttocks, lumbosacral junction tenderness; positive straight leg raise; strength 5/5 bilateral upper and lower extremities, significant hypertonicity in thoracic and lumbar paraspinal musculature, ropey texture, no overlying skin changes Neurological: Gross sensation intact, 2+/4 reflexes L4 and S1,  Skin: warm and dry   ASSESSMENT/PLAN:   Chronic back pain Uncontrolled lumbosacral radiculopathy.  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 Extend light duty at work for additional 6 weeks. He is agreeable. Work note provided. Continue ordinary activities within the limits permitted by pain.  Reviewed muscle relaxer dose 500 mg Robaxin 3 times daily as needed.  Continue naproxen twice daily.  Advised not to take any other NSAIDs while taking that naproxen.  Reviewed red flag symptoms. No  red flags suggesting cauda equina syndrome or progressive major motor weakness. Patient to return if symptoms do not improve with conservative treatment. Schedule follow up in 6 weeks.      August, DO PGY-3, [facility] 11/02/2020

## 2020-11-02 NOTE — Patient Instructions (Addendum)
 ??? ?? ?????? ????? ?????!  ??? ??? ??????? ? ???? ?? ???????? ????? ???? ??????? ????? ?????. ???? ???? ? ??   1500 ??? ?? ???????? ????? ?? ????? ? ?????? Robaxin ??? ???? (3) ???? ?? ????? ????????? (1) ??? ?? ?????.  ???? ?? ????? ?????? ??????? ???? ?? ????? ?? ?? ??????? 3-4 ???? ?? ?????. ???? ??? ???? ??????? ?? ???????? ??????? ??? ??? ??????????? (???? ?????? ?????).    ??? ?? ???? ??? ???? ?? 5 ??? 6 ?????? ??? ?? ????? ???? ???? ??? ??? ????? ??? ?? ??? ????? ??? ??? ?? ???? ???? ?? ?????? ????? ?????? ?? ?????? ???? ? ??? ???? ???   ??????? ???! It was great seeing you today!    As discusssed, stop the pharmacy to pick up your muscle relaxer and pain medication. For your back pain, Take 1500 mg Tylenol twice a day, take Robaxin up to three (3) times a day and Naprosyn (1) once a day.   Be sure to perform the stretches that were showed to you in clinic 3-4 times a day.  Stop by the [employer] [facility] or pharmacy to pick up some Lidocaine patches (see image below).    I'd like to see you back 5-6 weeks if your pain does not improve but if you need to be seen earlier than that for any new issues we're happy to fit you in, just give [place] a call!  Dr Rachael Darby ??????? ??????

## 2020-11-06 NOTE — Assessment & Plan Note (Signed)
Uncontrolled lumbosacral radiculopathy.  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 Extend light duty at work for additional 6 weeks. He is agreeable. Work note provided. Continue ordinary activities within the limits permitted by pain.  Reviewed muscle relaxer dose 500 mg Robaxin 3 times daily as needed.  Continue naproxen twice daily.  Advised not to take any other NSAIDs while taking that naproxen.  Reviewed red flag symptoms. No red flags suggesting cauda equina syndrome or progressive major motor weakness. Patient to return if symptoms do not improve with conservative treatment. Schedule follow up in 6 weeks.

## 2020-11-30 ENCOUNTER — Ambulatory Visit: Payer: Medicaid Other | Admitting: Family Medicine

## 2020-11-30 ENCOUNTER — Ambulatory Visit (INDEPENDENT_AMBULATORY_CARE_PROVIDER_SITE_OTHER): Payer: Medicaid Other

## 2020-11-30 ENCOUNTER — Encounter: Payer: Self-pay | Admitting: Family Medicine

## 2020-11-30 ENCOUNTER — Other Ambulatory Visit: Payer: Self-pay

## 2020-11-30 VITALS — BP 138/96 | HR 90 | Ht 76.0 in | Wt 181.6 lb

## 2020-11-30 DIAGNOSIS — G8929 Other chronic pain: Secondary | ICD-10-CM

## 2020-11-30 DIAGNOSIS — E781 Pure hyperglyceridemia: Secondary | ICD-10-CM | POA: Diagnosis not present

## 2020-11-30 DIAGNOSIS — Z23 Encounter for immunization: Secondary | ICD-10-CM

## 2020-11-30 DIAGNOSIS — M549 Dorsalgia, unspecified: Secondary | ICD-10-CM | POA: Diagnosis present

## 2020-11-30 DIAGNOSIS — Z227 Latent tuberculosis: Secondary | ICD-10-CM

## 2020-11-30 MED ORDER — NAPROXEN 500 MG PO TABS: 500.0000 mg | ORAL_TABLET | Freq: Two times a day (BID) | ORAL | 0 refills | Status: DC

## 2020-11-30 MED ORDER — METHOCARBAMOL 500 MG PO TABS: 500.0000 mg | ORAL_TABLET | Freq: Every evening | ORAL | 1 refills | Status: DC | PRN

## 2020-11-30 NOTE — Progress Notes (Signed)
   SUBJECTIVE:   CHIEF COMPLAINT / HPI:   Chief Complaint  Patient presents with   Back pain f/u     Shawn Stark is a 35 y.o. male here for chronic back pain follow-up. He reports continued lower back pain that radiates to his posterior knees. Pain worse with sitting. Has not been taking Robaxin as it caused dizziness.  He is taking Naprosyn once a day. 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 Patient denies bowel or bladder incontinence, saddle paresthesia, dysuria, fever, extremity weakness.  Denies recent trauma.    PERTINENT  PMH / PSH: reviewed and updated as appropriate   OBJECTIVE:   BP (!) 138/96   Pulse 90   Ht 6\' 4"  (1.93 m)   Wt 181 lb 9.6 oz (82.4 kg)   SpO2 100%   BMI 22.11 kg/m   GEN: pleasant well appearing male, in no acute distress  CV: regular rate and well perfused RESP: no increased work of breathing, no respiratory distress MSK: Lumbar hypertonicity, ropey consistency, buttocks and bilateral hamstring tenderness extending to the knee SKIN: warm, dry, no rash lumbar area NEURO: Gross sensation intact   ASSESSMENT/PLAN:   Chronic back pain Uncontrolled lumbosacral radiculopathy.  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 Extend light duty at work for additional 6 weeks. He is agreeable. Work note provided. Continue ordinary activities within the limits permitted by pain.    Robaxin at bedtime and Naprosyn twice daily.  Advised not to take any other NSAIDs while taking that naproxen.  Agreed to physical therapy.   Reviewed red flag symptoms. No red flags suggesting cauda equina syndrome or progressive major motor weakness. Patient to return if symptoms do not improve with conservative treatment. Scheduled follow up in 4-6 weeks.  -Consider Lyrica or gabapentin for neuropathic pain at that time  TB lung, latent Taking Isoniazid.      August, DO PGY-3, [facility] 11/30/2020

## 2020-11-30 NOTE — Assessment & Plan Note (Addendum)
Uncontrolled lumbosacral radiculopathy.  Has transitional sacral vertebrae on July lumbar xray.Extend light duty at work for additional 6 weeks. He is agreeable. Work note provided. Continue ordinary activities within the limits permitted by pain.    Robaxin at bedtime and Naprosyn twice daily.  Advised not to take any other NSAIDs while taking that naproxen.  Agreed to physical therapy.  Reviewed red flag symptoms. No red flags suggesting cauda equina syndrome or progressive major motor weakness. Patient to return if symptoms do not improve with conservative treatment. Scheduled follow up in 4-6 weeks.  -Consider Lyrica or gabapentin for neuropathic pain at that time

## 2020-11-30 NOTE — Assessment & Plan Note (Signed)
Taking Isoniazid.

## 2020-11-30 NOTE — Patient Instructions (Signed)
??? ?? ?????? ????? ?????!  ??? ??? ??????? ? ???? ?? ???????? ????? ???? ??????? ?????? ?????. ???? ???? ? ?????   1500 ??? ?? ???????? ????? ?????? ? ?????? ???????? ??? ????? ????????? ????? ??????.  ???? ?? ????? ?? ?????? ?????? ?????? ?????? ?????? ???????.  ??? ?? ???? ?? ?????? ?? ????? ????? (??????) ?????? 9:10 ?????? ??? ?? ????? ???? ? ???? ??? ??? ????? ??? ????? ??? ??? ?? ???? ???? ?? ?????? ????? ?????? ?? ?????? ???? ? ??? ???? ??? ??????? ???!  ????? Kesia Dalto  It was great seeing you today!    As discusssed, stop the pharmacy to pick up your muscle relaxer and pain medication. For your back pain, Take 1500 mg Tylenol twice a day, take Robaxin at bedtime and Naprosyn two times a day.    Be sure to look out for a phone call to schedule your physical therapy appointments.    I'd like to see you on December 8th at 9:10 AM if your pain does not improve but if you need to be seen earlier than that for any new issues we're happy to fit you in, just give [place] a call!  Dr Rachael Darby

## 2020-12-13 ENCOUNTER — Other Ambulatory Visit: Payer: Self-pay

## 2020-12-13 ENCOUNTER — Other Ambulatory Visit: Payer: Medicaid Other

## 2020-12-13 DIAGNOSIS — E781 Pure hyperglyceridemia: Secondary | ICD-10-CM

## 2020-12-14 LAB — LIPID PANEL
Chol/HDL Ratio: 4.7 ratio (ref 0.0–5.0)
Cholesterol, Total: 156 mg/dL (ref 100–199)
HDL: 33 mg/dL — ABNORMAL LOW (ref >39)
LDL Chol Calc (NIH): 98 mg/dL (ref 0–99)
Triglycerides: 139 mg/dL (ref 0–149)
VLDL Cholesterol Cal: 25 mg/dL (ref 5–40)

## 2021-01-05 ENCOUNTER — Ambulatory Visit: Payer: Medicaid Other | Admitting: Family Medicine

## 2021-01-05 NOTE — Progress Notes (Deleted)
   SUBJECTIVE:   CHIEF COMPLAINT / HPI:    Shawn Stark is a 35 y.o. male here for chronic back pain follow-up. He reports ***continued lower back pain that radiates to his posterior knees. Pain worse with sitting. Has not been taking Robaxin as it caused dizziness.  He is taking Naprosyn once a day. 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 Patient denies bowel or bladder incontinence, saddle paresthesia, dysuria, fever, extremity weakness.  Denies recent trauma.   PERTINENT  PMH / PSH: reviewed and updated as appropriate   OBJECTIVE:   There were no vitals taken for this visit.  ***  ASSESSMENT/PLAN:   No problem-specific Assessment & Plan notes found for this encounter.     Katha Cabal, DO PGY-3, [facility] 01/05/2021      {    This will disappear when note is signed, click to select method of visit    :1}

## 2021-01-18 ENCOUNTER — Encounter: Payer: Self-pay | Admitting: Family Medicine

## 2021-01-18 ENCOUNTER — Ambulatory Visit (INDEPENDENT_AMBULATORY_CARE_PROVIDER_SITE_OTHER): Payer: Medicaid Other | Admitting: Family Medicine

## 2021-01-18 ENCOUNTER — Other Ambulatory Visit: Payer: Self-pay

## 2021-01-18 VITALS — BP 132/85 | HR 83 | Ht 76.0 in | Wt 180.4 lb

## 2021-01-18 DIAGNOSIS — Z789 Other specified health status: Secondary | ICD-10-CM | POA: Diagnosis not present

## 2021-01-18 DIAGNOSIS — Z603 Acculturation difficulty: Secondary | ICD-10-CM | POA: Insufficient documentation

## 2021-01-18 DIAGNOSIS — M542 Cervicalgia: Secondary | ICD-10-CM | POA: Diagnosis not present

## 2021-01-18 DIAGNOSIS — M549 Dorsalgia, unspecified: Secondary | ICD-10-CM | POA: Diagnosis not present

## 2021-01-18 DIAGNOSIS — Z758 Other problems related to medical facilities and other health care: Secondary | ICD-10-CM

## 2021-01-18 DIAGNOSIS — G8929 Other chronic pain: Secondary | ICD-10-CM | POA: Diagnosis not present

## 2021-01-18 HISTORY — DX: Cervicalgia: M54.2

## 2021-01-18 HISTORY — DX: Acculturation difficulty: Z60.3

## 2021-01-18 HISTORY — DX: Other problems related to medical facilities and other health care: Z75.8

## 2021-01-18 MED ORDER — METHOCARBAMOL 500 MG PO TABS: 250.0000 mg | ORAL_TABLET | Freq: Every evening | ORAL | 1 refills | Status: DC | PRN

## 2021-01-18 MED ORDER — PREGABALIN 25 MG PO CAPS: 25.0000 mg | ORAL_CAPSULE | Freq: Two times a day (BID) | ORAL | 0 refills | Status: DC

## 2021-01-18 MED ORDER — NAPROXEN 250 MG PO TABS: 250.0000 mg | ORAL_TABLET | Freq: Two times a day (BID) | ORAL | 1 refills | Status: AC

## 2021-01-18 NOTE — Assessment & Plan Note (Signed)
Cervical strain with likely tension headaches.  Naprosyn 250 m BID, Robaxin 250 mg at bedtime. Scheduled follow up in 4-6 weeks

## 2021-01-18 NOTE — Assessment & Plan Note (Signed)
Pt unable to pick up meds from pharmacy as he was not able to communicate his needs effectively. Provided with handout to give to pharmacy upon arrival.

## 2021-01-18 NOTE — Assessment & Plan Note (Signed)
Uncontrolled lumbosacral radiculopathy.  Has known transitional sacral vertebrae seen on xray. Extend light duty at work for additional 8 weeks. Work note provided. Start Lyrica 25 mg BID. Use Robaxin at bedtime PRN and Naprosyn PRN.  Continue ordinary activities within the limits permitted by pain.  Advised not to take any other NSAIDs while taking that naproxen. Contact information provided for physical therapy.   Reviewed red flag symptoms. No red flags suggesting cauda equina syndrome or progressive major motor weakness. Patient to return if symptoms do not improve with conservative treatment. Scheduled follow up 02/28/20. -Consider Lyrica or gabapentin for neuropathic pain at that time

## 2021-01-18 NOTE — Patient Instructions (Addendum)
???? ????? ???????: ????? ????????? ????? ?? ?????. ????? ????????? ???????? (??? ????? ??????) ????????????? (??? ???) ??? ??? ????? ??? ????? ????? ?????. ° °???? ??????? ?????: (  [phone] ?????? ?????? ?????? ???????.   ??????  ??????? ??????   For your back and neck pain: Take pregablin twice a day.  Take Naproxen sodium (up to twice a day) and methocarbamol (1/2 tablet) if you need additional pain relief.     Please call Phone: [phone] to set up your physical therapy appointments.     Take Care,  Dr Rachael Darby

## 2021-01-18 NOTE — Progress Notes (Signed)
° °  SUBJECTIVE:   CHIEF COMPLAINT / HPI:   Chief Complaint  Patient presents with   Back pain f/u   Neck Pain   A Arabic interpreter was used for this encounter:  Name: Lutricia Feil ID #[id number]   Shawn Stark is a 35 y.o. male here for back pain follow up.  He continues to be on light duty at work.  He is taking the Naprosyn with some relief. He is not taking the muscle relaxer as he "could not relay message at the pharmacy".  He has not been seen by the physical therapist as "they haven't called me."    Has cramping neck pain that is worse with movement of his head for the past 3 weeks. Pain is intermittent tightness. Endorses headaches. Denies dizziness, jaw pain, numbness or tingling.    PERTINENT  PMH / PSH: reviewed and updated as appropriate   OBJECTIVE:   BP 132/85    Pulse 83    Ht 6\' 4"  (1.93 m)    Wt 180 lb 6.4 oz (81.8 kg)    SpO2 100%    BMI 21.96 kg/m    GEN: pleasant well appearing male, in no acute distress  CV: regular rate and well perfused RESP: no increased work of breathing, no respiratory distress MSK: diffuse cervical spine tenderness extending to lateral and thoraic trapezius, limited lateral rotation right > left, thoracic and lumbar hypertonicity with ropey consistency SKIN: warm, dry, no rash on back  NEURO: Gross sensation intact, reflexes 2/4 in S1/ L4, strength 5/5 bilateral UE and LE   ASSESSMENT/PLAN:   Neck pain Cervical strain with likely tension headaches.  Naprosyn 250 m BID, Robaxin 250 mg at bedtime. Scheduled follow up in 4-6 weeks  Chronic back pain Uncontrolled lumbosacral radiculopathy.  Has known transitional sacral vertebrae seen on xray. Extend light duty at work for additional 8 weeks. Work note provided. Start Lyrica 25 mg BID. Use Robaxin at bedtime PRN and Naprosyn PRN.  Continue ordinary activities within the limits permitted by pain.  Advised not to take any other NSAIDs while taking that naproxen. Contact information provided for  physical therapy.   Reviewed red flag symptoms. No red flags suggesting cauda equina syndrome or progressive major motor weakness. Patient to return if symptoms do not improve with conservative treatment. Scheduled follow up 02/28/20. -Consider Lyrica or gabapentin for neuropathic pain at that time  Language barrier affecting health care Pt unable to pick up meds from pharmacy as he was not able to communicate his needs effectively. Provided with handout to give to pharmacy upon arrival.      03/01/20, DO PGY-3, [facility] 01/18/2021

## 2021-02-27 ENCOUNTER — Other Ambulatory Visit: Payer: Self-pay

## 2021-02-27 ENCOUNTER — Ambulatory Visit: Payer: Medicaid Other | Admitting: Family Medicine

## 2021-02-27 ENCOUNTER — Encounter: Payer: Self-pay | Admitting: Family Medicine

## 2021-02-27 ENCOUNTER — Ambulatory Visit
Admission: RE | Admit: 2021-02-27 | Discharge: 2021-02-27 | Disposition: A | Discharge status: Home / Self Care | Payer: Medicaid Other | Source: Ambulatory Visit | Attending: Family Medicine | Admitting: Family Medicine

## 2021-02-27 VITALS — BP 137/86 | HR 80 | Ht 76.0 in | Wt 182.8 lb

## 2021-02-27 DIAGNOSIS — G8929 Other chronic pain: Secondary | ICD-10-CM

## 2021-02-27 DIAGNOSIS — M549 Dorsalgia, unspecified: Secondary | ICD-10-CM

## 2021-02-27 DIAGNOSIS — M542 Cervicalgia: Secondary | ICD-10-CM

## 2021-02-27 MED ORDER — PREGABALIN 50 MG PO CAPS: 50.0000 mg | ORAL_CAPSULE | Freq: Two times a day (BID) | ORAL | 0 refills | Status: DC

## 2021-02-27 NOTE — Patient Instructions (Addendum)
???? ??? ??? ??????? ?????? ??? ???? ????? ????? °???????   [facility] ????????? [address]  [phone]  ???? ????? ???????: ????? ????????? ????? ?? ?????. ????? ????????? ???????? (??? ????? ??????) ????????????? (??? ???) ??? ??? ????? ??? ????? ????? ?????.   ???? ??????? ?????: (336) [phone] ?????? ?????? ?????? ???????.   Go to this address to get an xray of your back: [facility] [address]  [phone]  For your back and neck pain: Take pregablin 50 mg twice a day.  Take Naproxen sodium (up to twice a day) and methocarbamol (1/2 tablet) if you need additional pain relief.    Please call Phone: [phone] to set up your physical therapy appointments.

## 2021-02-27 NOTE — Progress Notes (Signed)
° °  SUBJECTIVE:   CHIEF COMPLAINT / HPI:      Shawn Stark is a 36 y.o. male here for back pain follow up.    A Arabic interpreter was used for this encounter:  Name: Dawald ID #[id number]  Has bilateral lower back pain since September that radiates down both of his legs.  He is taking the Naprosyn and a  muscle relaxer with some relief. Neck pain has resolved.  He has not been seen by the physical therapist as he has been working out at home.  He continues to be on light duty at work.  Denies arm numbness, tingling, weakness  recent trauma. No bowel or bladder incontinence, saddle paresthesia, fever, night sweats, dysuria.   PERTINENT  PMH / PSH: reviewed and updated as appropriate   OBJECTIVE:   BP 137/86    Pulse 80    Ht 6\' 4"  (1.93 m)    Wt 182 lb 12.8 oz (82.9 kg)    SpO2 100%    BMI 22.25 kg/m   GEN: pleasant well appearing male, in no acute distress  CV: regular rate, DP and radial pulses equal  RESP: no increased work of breathing, no respiratory distress MSK: no cervical spine tenderness, bilateral lumbar paraspinal tenderness, no SI Joint tenderness, no midline spinous process tenderness SKIN: warm, dry, no rash on back  NEURO: Gross sensation intact, reflexes 2/4 in S1/ L4, strength 5/5 bilateral UE and LE  ASSESSMENT/PLAN:   Neck pain Resolved.   Chronic back pain Uncontrolled lumbosacral radiculopathy.  Has known transitional sacral vertebrae seen on xray in July 2022. Continues being on light duty at his job. Work note offered but pt declined.  Increase Lyrica 50 mg BID. Use Robaxin at bedtime PRN and Naprosyn PRN.  Continue ordinary activities within the limits permitted by pain.  Advised not to take any other NSAIDs while taking that naproxen. Discussed benefit of physical therapy to help with back pain. Pt agreeable. Contact information provided for physical therapy in [place]. Obtain interval lumbar xray.   Reviewed red flag symptoms. No red flags suggesting cauda  equina syndrome or progressive major motor weakness. Patient to return if symptoms do not improve in 4-6 weeks.  Consider advanced imaging vs referral to sports medicine.      August 2022, DO PGY-3, [facility] 02/27/2021

## 2021-02-27 NOTE — Assessment & Plan Note (Signed)
Uncontrolled lumbosacral radiculopathy.  Has known transitional sacral vertebrae seen on xray in July 2022. Continues being on light duty at his job. Work note offered but pt declined.  Increase Lyrica 50 mg BID. Use Robaxin at bedtime PRN and Naprosyn PRN.  Continue ordinary activities within the limits permitted by pain.  Advised not to take any other NSAIDs while taking that naproxen. Discussed benefit of physical therapy to help with back pain. Pt agreeable. Contact information provided for physical therapy in [place]. Obtain interval lumbar xray.   Reviewed red flag symptoms. No red flags suggesting cauda equina syndrome or progressive major motor weakness. Patient to return if symptoms do not improve in 4-6 weeks.  Consider advanced imaging vs referral to sports medicine.

## 2021-02-27 NOTE — Assessment & Plan Note (Signed)
Resolved

## 2021-03-30 ENCOUNTER — Ambulatory Visit: Payer: Medicaid Other | Admitting: Family Medicine

## 2021-03-30 ENCOUNTER — Other Ambulatory Visit: Payer: Self-pay

## 2021-03-30 ENCOUNTER — Encounter: Payer: Self-pay | Admitting: Family Medicine

## 2021-03-30 DIAGNOSIS — G8929 Other chronic pain: Secondary | ICD-10-CM

## 2021-03-30 DIAGNOSIS — M549 Dorsalgia, unspecified: Secondary | ICD-10-CM

## 2021-03-30 MED ORDER — PREGABALIN 50 MG PO CAPS: 50.0000 mg | ORAL_CAPSULE | Freq: Two times a day (BID) | ORAL | 0 refills | Status: DC

## 2021-03-30 NOTE — Progress Notes (Signed)
? ?  SUBJECTIVE:  ? ?CHIEF COMPLAINT / HPI:  ? ? ?Shawn Stark is a 36 y.o. male here for back pain follow up.   ?  ?A Arabic interpreter was used for this encounter:  Name: Selena Lesser ID #[id number] ?  ?Has bilateral lower back pain since September that radiates down both of his legs.  He is taking Lyrica and a  muscle relaxer with some relief.  States his pain is improving.  Endorses pain that radiates into his legs.  Denies neck pain.  He has not been seen by the physical therapist as he has been working out at home.  He continues to be on light duty at work and will likely stay in his new room.  Denies arm numbness, tingling, weakness  recent trauma. No bowel or bladder incontinence, saddle paresthesia, fever, night sweats, dysuria. ?  ? ?PERTINENT  PMH / PSH: reviewed and updated as appropriate  ? ?OBJECTIVE:  ? ?BP 140/88   Pulse 63   Wt 186 lb 6.4 oz (84.6 kg)   SpO2 99%   BMI 22.69 kg/m?   ?GEN: well appearing male in no acute distress  ?CVS: well perfused  ?RESP: speaking in full sentences without pause, no respiratory distress  ?MSK:  ?- Inspection: no gross deformity or asymmetry, swelling or ecchymosis. No skin changes of the T, L spine ?- Palpation: No TTP over the spinous processes or SI joints b/l, hypertonicity and tenderness of the lumbar paraspinal muscles ?- ROM: full active ROM of the lumbar spine in flexion and extension without pain today  ?- Strength: 5/5 strength of lower extremity in L4-S1 nerve root distributions b/l ?- Neuro: sensation intact in the L4-S1 nerve root distribution b/l, 2+ L4 and S1 reflexes ? ? ? ?ASSESSMENT/PLAN:  ? ?Chronic back pain ?Improving lumbosacral radiculopathy.  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  Reviewed these x-rays with the patient today.  Continue Lyrica 50 mg twice daily with Robaxin as needed at bedtime.  He is working out at home but will look into starting physical therapy.  He continues to be on light duty at his job.  No work  note required. ? ?No red flags suggestive of cauda equina syndrome or increasing motor weakness.  Follow-up appointment scheduled. ?  ? ? ?Katha Cabal, DO ?PGY-3, [facility] ?03/30/2021  ? ? ? ? ? ? ? ? ?

## 2021-03-30 NOTE — Patient Instructions (Addendum)
??? ?????? ??? ?? ????? ????. ??? ??? ????? ??? ????? ????? ? ????? ??? ????? ?? ???????? ?????? ?? ?? ??????? ??????? ???   [phone]. ? ????? ????? ???????: ????? ????????? 50 ??? ????? ?? ?????. ?? ???????????? (1/2 ???) ??? ??? ????? ??? ????? ????? ?????. ? ? ? ? ????? ??? ?? 25 ????? (?????) ?????? 8:50 ??????.???? ??????? ?????: [phone] ?????? ?????? ?????? ???????. ? ?Make an appointment if your pain does not improve. If you need a refill, you can request one from your pharmacy or call the office at [phone].   ? ?For your back and neck pain: Take pregablin 50 mg twice a day.  Take  methocarbamol (1/2 tablet) if you need additional pain relief.  ?  ?  ?Please call Phone: [phone] to set up your physical therapy appointments.  ? ?Follow up with me on April 25 at 8:50 AM.  ?

## 2021-03-31 ENCOUNTER — Encounter: Payer: Self-pay | Admitting: Family Medicine

## 2021-03-31 NOTE — Assessment & Plan Note (Signed)
Improving lumbosacral radiculopathy.  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  Reviewed these x-rays with the patient today.  Continue Lyrica 50 mg twice daily with Robaxin as needed at bedtime.  He is working out at home but will look into starting physical therapy.  He continues to be on light duty at his job.  No work note required. ? ?No red flags suggestive of cauda equina syndrome or increasing motor weakness.  Follow-up appointment scheduled. ?

## 2021-05-23 ENCOUNTER — Ambulatory Visit: Payer: Self-pay | Admitting: Family Medicine

## 2021-05-23 NOTE — Progress Notes (Deleted)
   SUBJECTIVE:   CHIEF COMPLAINT / HPI:   No chief complaint on file.    Shawn Stark is a 36 y.o. male here for ***   Pt reports ***    PERTINENT  PMH / PSH: reviewed and updated as appropriate   OBJECTIVE:   There were no vitals taken for this visit.  ***  ASSESSMENT/PLAN:   No problem-specific Assessment & Plan notes found for this encounter.     Katha Cabal, DO PGY-3, [facility] 05/23/2021      {    This will disappear when note is signed, click to select method of visit    :1}

## 2021-06-13 ENCOUNTER — Ambulatory Visit (INDEPENDENT_AMBULATORY_CARE_PROVIDER_SITE_OTHER): Payer: Self-pay | Admitting: Family Medicine

## 2021-06-13 ENCOUNTER — Encounter: Payer: Self-pay | Admitting: Family Medicine

## 2021-06-13 DIAGNOSIS — Z789 Other specified health status: Secondary | ICD-10-CM

## 2021-06-13 DIAGNOSIS — G8929 Other chronic pain: Secondary | ICD-10-CM

## 2021-06-13 DIAGNOSIS — M549 Dorsalgia, unspecified: Secondary | ICD-10-CM

## 2021-06-13 MED ORDER — PREGABALIN 50 MG PO CAPS: 50.0000 mg | ORAL_CAPSULE | Freq: Two times a day (BID) | ORAL | 0 refills | Status: DC

## 2021-06-13 MED ORDER — METHOCARBAMOL 500 MG PO TABS: 250.0000 mg | ORAL_TABLET | Freq: Every evening | ORAL | 1 refills | Status: DC | PRN

## 2021-06-13 NOTE — Patient Instructions (Addendum)
???? ????? ???????: ????? ?????????   50 ??? ????? ?? ?????. ?? ???????????? (1/2 ???) ??? ??? ????? ??? ????? ????? ?????. ? ????? ??????? ?????: [phone] ?????? ?????? ?????? ???????. ? ???? ?????? ??? ?? ????? ????. ??? ??? ????? ??? ????? ????? ? ????? ??? ????? ?? ???????? ?????? ?? ?? ??????? ??????? ??? [phone]. ? ? ????? ??? ?? 19 ?????? (?????) 2023 ?????? 8:50 ??????. ? ?For your back and neck pain: Take pregablin 50 mg twice a day.  Take  methocarbamol (1/2 tablet) if you need additional pain relief.  ?  ?Please call Phone: [phone] to set up your physical therapy appointments.  ? ?Make an appointment if your pain does not improve. If you need a refill, you can request one from your pharmacy or call the office at [phone]. ?  ?Follow up with me on July 17, 2021 at 8:50 AM.   ?

## 2021-06-13 NOTE — Progress Notes (Signed)
   SUBJECTIVE:   CHIEF COMPLAINT / HPI:   A in person Arabic interpreter was used for this encounter. Name: Shawn Stark is a 36 y.o. male here for back pain follow-up.  Pain rated 6 out of 10.  Reports some improvement since he changed his work duties.  Taking Lyrica, naproxen and muscle relaxer as needed.  Continues to radiate down into his knees.  Has some shoulder and neck pain.  He has scheduled an appointment with his physical therapist because he said no one has been able to call for him.  Denies bowel or bladder can incontinence, saddle paresthesia, fever, numbness, weakness.   PERTINENT  PMH / PSH: reviewed and updated as appropriate   OBJECTIVE:   BP 136/82   Pulse 77   Ht 6\' 4"  (1.93 m)   Wt 178 lb 6 oz (80.9 kg)   SpO2 98%   BMI 21.71 kg/m    General: Well-appearing male in no acute distress. Neck: Normal range of motion, multiple trapezius is tender points, hypertonicity of paraspinal muscles, no midline tenderness Cardiovascular: Well perfused Respiratory: Speaking in full sentences without pause no respiratory distress MSK: C-spine as above, T-spine normal, L-spine: Hypertonicity of lumbar paraspinal muscles, no midline tenderness, normal gait, normal flexion and extension, mild pain with extension, no muscle spasms with range of motion, neurovascular intact Skin: Warm and dry  ASSESSMENT/PLAN:   Language barrier affecting health care Patient has been unable to schedule his physical therapy appointments due to language barrier.  Interpreter today is willing to help.  Chronic back pain Improving lumbosacral radiculopathy.  Patient with known transitional sacral vertebrae seen on previous x-rays.  Continue Lyrica 50 mg twice daily with Robaxin as needed at bedtime.  Over-the-counter naproxen as needed.  Refills of Lyrica and Robaxin sent today.  He has changed to the lighter work duty job.  He does not need a work note.  He will work with the interpreter  to schedule his physical therapy appointment.    No red flags suggestive of cauda equina syndrome or increasing motor weakness.  Follow-up appointment scheduled.     , DO PGY-3, [facility] 06/15/2021

## 2021-06-15 ENCOUNTER — Encounter: Payer: Self-pay | Admitting: Family Medicine

## 2021-06-15 NOTE — Assessment & Plan Note (Signed)
Improving lumbosacral radiculopathy.  Patient with known transitional sacral vertebrae seen on previous x-rays.  Continue Lyrica 50 mg twice daily with Robaxin as needed at bedtime.  Over-the-counter naproxen as needed.  Refills of Lyrica and Robaxin sent today.  He has changed to the lighter work duty job.  He does not need a work note.  He will work with the interpreter to schedule his physical therapy appointment.   No red flags suggestive of cauda equina syndrome or increasing motor weakness.  Follow-up appointment scheduled.

## 2021-06-15 NOTE — Assessment & Plan Note (Signed)
Patient has been unable to schedule his physical therapy appointments due to language barrier.  Interpreter today is willing to help.

## 2021-07-17 ENCOUNTER — Ambulatory Visit (INDEPENDENT_AMBULATORY_CARE_PROVIDER_SITE_OTHER): Payer: Self-pay | Admitting: Family Medicine

## 2021-07-17 VITALS — BP 138/80 | HR 66 | Ht 76.0 in | Wt 181.4 lb

## 2021-07-17 DIAGNOSIS — G8929 Other chronic pain: Secondary | ICD-10-CM

## 2021-07-17 DIAGNOSIS — M549 Dorsalgia, unspecified: Secondary | ICD-10-CM

## 2021-07-17 DIAGNOSIS — Z789 Other specified health status: Secondary | ICD-10-CM

## 2021-07-17 DIAGNOSIS — M5416 Radiculopathy, lumbar region: Secondary | ICD-10-CM

## 2021-07-17 DIAGNOSIS — Z758 Other problems related to medical facilities and other health care: Secondary | ICD-10-CM

## 2021-07-17 MED ORDER — PREGABALIN 50 MG PO CAPS: 50.0000 mg | ORAL_CAPSULE | Freq: Two times a day (BID) | ORAL | 0 refills | Status: DC

## 2021-07-17 MED ORDER — METHOCARBAMOL 500 MG PO TABS: 250.0000 mg | ORAL_TABLET | Freq: Every evening | ORAL | 2 refills | Status: DC | PRN

## 2021-07-17 NOTE — Assessment & Plan Note (Signed)
Stable lumbar sacral radiculopathy.  Refilled Robaxin 250 mg as needed and pregabalin 50 mg twice daily.  He remains on light duty at his job.  Work note offered but declined.  He has a known transitional cervical vertebrae that was seen on previous x-rays.  Referral for physical therapy placed.  Patient provided with information to call and set up his appointment with an interpreter.  He has no red flag symptoms to suggest acute pathology.  Follow-up appointment scheduled for 1 month.  Would like him to space appointments after he is started physical therapy for 3 months.  Patient agrees with plan.

## 2021-07-17 NOTE — Patient Instructions (Signed)
???? ????? ???????: ????? ?????????   50 ??? ????? ?? ?????. ?? ???????????? (1/2 ???) ??? ??? ????? ??? ????? ????? ?????.  ???? ??????? ?????: [phone] ?????? ?????? ?????? ???????.  ??? ?????? ??? ?? ????? ????. ??? ??? ????? ??? ????? ????? ? ????? ??? ????? ?? ???????? ?????? ?? ?? ??????? ??????? ??? [phone].  ???? ??? ?? 14 ???? (?????) 2023 ?????? 8:30 ??????  For your back and neck pain: Take pregablin 50 mg twice a day.  Take  methocarbamol (1/2 tablet) if you need additional pain relief.    Please call Phone: [phone] to set up your physical therapy appointments.    Make an appointment if your pain does not improve. If you need a refill, you can request one from your pharmacy or call the office at [phone].   Follow up with me on August 11, 2021 at 8:30 AM

## 2021-07-17 NOTE — Progress Notes (Signed)
   SUBJECTIVE:   CHIEF COMPLAINT / HPI:   A arabaic interpreter was used for this encounter:  Name: Lafonda Mosses ID #[id number]  Shawn Stark is a 36 y.o. male here for back pain follow-up.  States that his pain has improved with the medication.  Pain radiates down to his bilateral knees.  Continues to have some neck and shoulder pain.  He called the number listed for physical therapy however he was told that he needed a new referral.  Continues to be on lighter duty at his job.  Denies bowel or bladder incontinence, no saddle paresthesia, fever, dysuria, numbness or weakness.  He has not had any recent trauma or falls.    PERTINENT  PMH / PSH: reviewed and updated as appropriate   OBJECTIVE:   BP 138/80   Pulse 66   Ht 6\' 4"  (1.93 m)   Wt 181 lb 6.4 oz (82.3 kg)   SpO2 99%   BMI 22.08 kg/m    General: Well-appearing male in no acute distress Cardiovascular: Regular rate and rhythm and well-perfused Respiratory: Clear to auscultation bilaterally, no respiratory distress Musculoskeletal: Bilateral lumbar hypertonicity, tenderness to palpation lumbar paraspinals, normal gait, normal flexion and extension, some pain with extension, no active muscle spasms appreciated, neurovascularly intact with no overlying skin changes Skin: Warm and dry  ASSESSMENT/PLAN:   Chronic back pain Stable lumbar sacral radiculopathy.  Refilled Robaxin 250 mg as needed and pregabalin 50 mg twice daily.  He remains on light duty at his job.  Work note offered but declined.  He has a known transitional cervical vertebrae that was seen on previous x-rays.  Referral for physical therapy placed.  Patient provided with information to call and set up his appointment with an interpreter.  He has no red flag symptoms to suggest acute pathology.  Follow-up appointment scheduled for 1 month.  Would like him to space appointments after he is started physical therapy for 3 months.  Patient agrees with plan.     ,  DO PGY-3, [facility] 07/17/2021

## 2021-08-08 ENCOUNTER — Telehealth: Payer: Self-pay

## 2021-08-08 ENCOUNTER — Ambulatory Visit: Payer: Medicaid Other | Attending: Family Medicine

## 2021-08-08 DIAGNOSIS — M549 Dorsalgia, unspecified: Secondary | ICD-10-CM | POA: Insufficient documentation

## 2021-08-08 DIAGNOSIS — M6281 Muscle weakness (generalized): Secondary | ICD-10-CM | POA: Insufficient documentation

## 2021-08-08 DIAGNOSIS — M5459 Other low back pain: Secondary | ICD-10-CM | POA: Insufficient documentation

## 2021-08-08 DIAGNOSIS — G8929 Other chronic pain: Secondary | ICD-10-CM | POA: Insufficient documentation

## 2021-08-08 NOTE — Therapy (Signed)
OUTPATIENT PHYSICAL THERAPY THORACOLUMBAR EVALUATION   Patient Name: Shawn Stark MRN: [id number] DOB:08-27-1985, 36 y.o., male Today's Date: 08/08/2021   PT End of Session - 08/08/21 1304     Visit Number 1    Number of Visits 9    Date for PT Re-Evaluation 09/19/21    Authorization Type MCD    Progress Note Due on Visit 9    PT Start Time 1225    PT Stop Time 1305    PT Time Calculation (min) 40 min    Activity Tolerance Patient tolerated treatment well    Behavior During Therapy [facility] for tasks assessed/performed             History reviewed. No pertinent past medical history. History reviewed. No pertinent surgical history. 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   Atypical chest pain 08/09/2020   Chronic back pain 08/09/2020   Tinea pedis of left foot 08/09/2020   Tobacco use disorder 08/09/2020   Constipation 08/09/2020   Encounter for health examination of refugee 08/05/2020    PCP: Evelena Leyden, DO  REFERRING PROVIDER: Westley Chandler, MD  REFERRING DIAG: [phone] (ICD-10-CM) - Chronic bilateral back pain, unspecified back location   Rationale for Evaluation and Treatment Rehabilitation  THERAPY DIAG: chronic low bck pain   ONSET DATE: chronic  SUBJECTIVE:                                                                                                                                                                                           SUBJECTIVE STATEMENT: Low back over a period of 5 years, tried to manage with meds with no lasting effects.  Pain worse in AM better with activity and lying down PERTINENT HISTORY:  Shawn Stark is a 36 y.o. male here for back pain follow-up.  States that his pain has improved with the medication.  Pain radiates down to his bilateral knees.  Continues  to have some neck and shoulder pain.  He called the number listed for physical therapy however he was told that he needed a new referral.  Continues to be on lighter duty at his job.  Denies bowel or bladder incontinence, no saddle paresthesia, fever, dysuria, numbness or weakness.  He has not had any recent trauma or falls.   PAIN:  Are you having pain? Yes, 8/10 at worst, central low back, present when he awakens, improved with lying down.   PRECAUTIONS: None  WEIGHT BEARING RESTRICTIONS No  FALLS:  Has patient fallen in last 6 months? No  LIVING ENVIRONMENT: Lives with: lives with their family Lives in: House/apartment Stairs:  yes Has following equipment at home: None  OCCUPATION: Associate Professor  PLOF: Independent  PATIENT GOALS To reduce and manage my back pain   OBJECTIVE:   DIAGNOSTIC FINDINGS:  None available  PATIENT SURVEYS:  Not assessed due to language barrier  SCREENING FOR RED FLAGS: Bowel or bladder incontinence: No  COGNITION:  Overall cognitive status: Within functional limits for tasks assessed     SENSATION: Not tested  MUSCLE LENGTH: Hamstrings: Right 70 deg; Left 70 deg Thomas test: unremarkable B  POSTURE: rounded shoulders  PALPATION: Not assessed  LUMBAR ROM: All AROM produce diffuse low back pain  Active  A/PROM  eval  Flexion 90%  Extension 90%  Right lateral flexion 90%  Left lateral flexion 90%  Right rotation 75%  Left rotation 75%   (Blank rows = not tested)  LOWER EXTREMITY ROM:     Active  Right eval Left eval  Hip flexion WNL WNL  Hip extension WNL WNL  Hip abduction    Hip adduction    Hip internal rotation    Hip external rotation    Knee flexion WNL WNL  Knee extension WNL WNL  Ankle dorsiflexion    Ankle plantarflexion    Ankle inversion    Ankle eversion     (Blank rows = not tested)  LOWER EXTREMITY MMT:    MMT Right eval Left eval  Hip flexion    Hip extension    Hip abduction    Hip  adduction    Hip internal rotation    Hip external rotation    Knee flexion    Knee extension    Ankle dorsiflexion    Ankle plantarflexion    Ankle inversion    Ankle eversion    Core 3+ 3+   (Blank rows = not tested)  LUMBAR SPECIAL TESTS:  Straight leg raise test: Negative, Slump test: Positive, and FABER test: Negative   FUNCTIONAL TESTS:  5 times sit to stand: 13s arms crossed  GAIT: Distance walked: 55ft x2 Assistive device utilized: None Level of assistance: Complete Independence    TODAY'S TREATMENT  Eval    PATIENT EDUCATION:  Education details: Discussed eval findings, rehab rationale and POC and patient is in agreement  Person educated: Patient Education method: Explanation Education comprehension: verbalized understanding and needs further education   HOME EXERCISE PROGRAM: Access Code: [email or web address] URL: [email or web address] Date: 08/08/2021 Prepared by: Gustavus Bryant  Exercises - Supine Bridge  - 2 x daily - 7 x weekly - 1 sets - 15 reps - Curl Up with Arms Crossed  - 2 x daily - 7 x weekly - 1 sets - 15 reps - Seated Table Hamstring Stretch  - 2 x daily - 7 x weekly - 1 sets - 2 reps - 30s hold  ASSESSMENT:  CLINICAL IMPRESSION: Patient is a 36 y.o. male who was seen today for physical therapy evaluation and treatment for chronic low back pain.    OBJECTIVE IMPAIRMENTS decreased activity tolerance, decreased strength, impaired flexibility, improper body mechanics, postural dysfunction, and pain.   ACTIVITY LIMITATIONS lifting, bending, sitting, standing, and sleeping  PERSONAL FACTORS Age, Past/current experiences, and Time since onset of injury/illness/exacerbation are also affecting patient's functional outcome.   REHAB POTENTIAL: Good  CLINICAL DECISION MAKING: Stable/uncomplicated  EVALUATION COMPLEXITY: Low   GOALS: Goals reviewed with patient? No  SHORT TERM  GOALS: Target date: 08/22/2021  Patient to demonstrate  independence in HEP  Baseline:[id number] Goal status: INITIAL    LONG TERM GOALS: Target date: 09/05/2021  Patient to demonstrate independence in HEP  Baseline:  Goal status: INITIAL  2.  90% B trunk rotation Baseline: 75% B rotation Goal status: INITIAL  3.  4/5 core strength  Baseline: 3+/5 core strength Goal status: INITIAL  4.  Decrease worst pain to 6/10 Baseline: 10/10 pain Goal status: INITIAL    PLAN: PT FREQUENCY: 2x/week  PT DURATION: 4 weeks  PLANNED INTERVENTIONS: Therapeutic exercises, Therapeutic activity, Neuromuscular re-education, Balance training, Gait training, Patient/Family education, Joint mobilization, Manual therapy, and Re-evaluation.  PLAN FOR NEXT SESSION: HEP update an review, core and LE strength, posture and body mechanics.    Hildred Laser, PT 08/08/2021, 1:05 PM   Check all possible CPT codes: 23557 - PT Re-evaluation, 97110- Therapeutic Exercise, [phone]- Neuro Re-education, [phone] - Gait Training, [phone] - Manual Therapy, 97530 - Therapeutic Activities, and [phone] - Self Care     If treatment provided at initial evaluation, no treatment charged due to lack of authorization.

## 2021-08-11 ENCOUNTER — Ambulatory Visit: Payer: Medicaid Other | Admitting: Family Medicine

## 2021-08-22 ENCOUNTER — Encounter: Payer: Medicaid Other | Admitting: Physical Therapy

## 2021-08-24 ENCOUNTER — Encounter: Payer: Medicaid Other | Admitting: Physical Therapy

## 2021-08-29 ENCOUNTER — Encounter: Payer: Medicaid Other | Admitting: Physical Therapy

## 2021-08-31 ENCOUNTER — Encounter: Payer: Medicaid Other | Admitting: Physical Therapy

## 2021-09-07 ENCOUNTER — Encounter: Payer: Medicaid Other | Admitting: Physical Therapy

## 2021-10-02 NOTE — Progress Notes (Signed)
    SUBJECTIVE:   CHIEF COMPLAINT / HPI:   36 year old male with hx of chronic back pain presents with lower back pain His home medication include lyrica 50mg  BID Lower back pain has been on off for over 10 years but worse in the last week Described pain as sharp stabbing pain Pain is 6/10 when worse. It comes on and off but not daily. No injuries or trauma to the back Uses Lyrica which provides some relieve Exercise and walking sometimes worsen the pain. Lifting heavy boxes at work some times worsen the pain.  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  No LE weakness or changes in gait.  Denies any urinary or Bowel incontinence of bladder or bowel.     Most recent Lumbar spine imagining 7 months ago showed transitional anatomy at S1 without any degenerative changes.  PERTINENT  PMH / PSH: Chronic Back pain  OBJECTIVE:   BP (!) 136/94   Pulse 66   Ht 6\' 4"  (1.93 m)   Wt 179 lb 6.4 oz (81.4 kg)   SpO2 100%   BMI 21.84 kg/m    Physical Exam General: Alert, well appearing, NAD Cardiovascular: RRR, No Murmurs, Normal S2/S2 Respiratory: CTAB, No wheezing or Rales Abdomen: No distension or tenderness Musculoskeletal: Bilateral lower back mild tenderness with palpation. No CVA tenderness    ASSESSMENT/PLAN:   Back pain  The nature of his back pain is more musculoskeletal consistent with muscle sprain.  No red flag symptoms, denies incontinence or saddle paresthesia. Exam notable for area of mild tenderness at lower back bilaterally. He has 5/5 muscle strength on all extremities with sensation intact.  -Recommend use of lidocaine patch  -Continue taking daily Lyrica as prescribed -Start baclofen 10 mg 3 times daily -Educated patient on proper posture for lifting heavy object from floor level. -Reviewed return precautions.  Elevated BP Patient with no formal diagnosis of hypertension presents with elevated blood pressure today of 136/94.  He is  currently asymptomatic and not on any medication. -Recommend patient keeps ambulatory blood pressure diary and follow-up in 2 weeks to assess need for medication.   Midwife, MD [facility]

## 2021-10-03 ENCOUNTER — Ambulatory Visit (INDEPENDENT_AMBULATORY_CARE_PROVIDER_SITE_OTHER): Payer: Self-pay | Admitting: Student

## 2021-10-03 VITALS — BP 136/94 | HR 66 | Ht 76.0 in | Wt 179.4 lb

## 2021-10-03 DIAGNOSIS — G8929 Other chronic pain: Secondary | ICD-10-CM

## 2021-10-03 DIAGNOSIS — M5440 Lumbago with sciatica, unspecified side: Secondary | ICD-10-CM

## 2021-10-03 MED ORDER — BACLOFEN 10 MG PO TABS: 10.0000 mg | ORAL_TABLET | Freq: Three times a day (TID) | ORAL | 0 refills | Status: DC

## 2021-10-03 NOTE — Patient Instructions (Addendum)
It was wonderful to meet you today. Thank you for allowing me to be a part of your care. Below is a short summary of what we discussed at your visit today:  Your your chronic back pain is likely due to muscle sprain from lifting heavy boxes at work.  Baclofen 10 mg 3 times daily for back pain.  Continue taking your Lyrica as prescribed.  I recommend get over-the-counter lidocaine patch and apply it on your lower back, usually in the evenings after work.  Be mindful of your posture when you are lifting heavy boxes because this could aggravate the lower back pain.  Please bring all of your medications to every appointment!  If you have any questions or concerns, please do not hesitate to contact us via phone or MyChart message.   Jerre Simon, MD [facility]

## 2021-10-16 ENCOUNTER — Encounter: Payer: Self-pay | Admitting: Family Medicine

## 2021-10-16 ENCOUNTER — Ambulatory Visit (INDEPENDENT_AMBULATORY_CARE_PROVIDER_SITE_OTHER): Payer: Self-pay | Admitting: Family Medicine

## 2021-10-16 VITALS — BP 137/77 | HR 84 | Ht 76.0 in | Wt 184.6 lb

## 2021-10-16 DIAGNOSIS — I10 Essential (primary) hypertension: Secondary | ICD-10-CM

## 2021-10-16 DIAGNOSIS — Z227 Latent tuberculosis: Secondary | ICD-10-CM

## 2021-10-16 DIAGNOSIS — Z23 Encounter for immunization: Secondary | ICD-10-CM

## 2021-10-16 DIAGNOSIS — Z87891 Personal history of nicotine dependence: Secondary | ICD-10-CM

## 2021-10-16 MED ORDER — AMLODIPINE BESYLATE 2.5 MG PO TABS: 2.5000 mg | ORAL_TABLET | Freq: Every day | ORAL | 0 refills | Status: DC

## 2021-10-16 NOTE — Assessment & Plan Note (Signed)
BP mildly elevated on repeat at 137/77.  Given fluctuating measurements that were taken with home blood pressure cuff, will initiate low-dose medication at this time. - Amlodipine 2.5 mg daily - Discussed return precautions and hypotension symptoms - Follow-up in about 4 weeks - Continue to check blood pressure about 1-2 times per day

## 2021-10-16 NOTE — Assessment & Plan Note (Signed)
Previously treated with isoniazid

## 2021-10-16 NOTE — Assessment & Plan Note (Signed)
Quit cold [place] 1 month ago with good success so far. Congratulated patient and encouraged continued cessation.

## 2021-10-16 NOTE — Progress Notes (Signed)
    SUBJECTIVE:   CHIEF COMPLAINT / HPI:   HTN: -BP has been elevated on most recent visits - Has BP cuff at home and has been checking about 1-2 times per day - Home BP measurements range from 366Q-947M systolic per the machine - When compared to clinic machine, home blood pressure machine is measuring about 20 mmHg higher on systolic measurements - Denies any SOB, CP, vision changes, LE edema, medication SEs, or symptoms of hypotension  Tobacco use - Quit 1 month prior cold [place] - Overall feels well  PERTINENT  PMH / PSH: Reviewed  OBJECTIVE:   BP 137/77   Pulse 84   Ht 6\' 4"  (1.93 m)   Wt 184 lb 9.6 oz (83.7 kg)   SpO2 100%   BMI 22.47 kg/m   General: NAD, well-appearing, well-nourished Respiratory: No respiratory distress, breathing comfortably, able to speak in full sentences Skin: warm and dry, no rashes noted on exposed skin Psych: Appropriate affect and mood  ASSESSMENT/PLAN:   History of tobacco use Quit cold [place] 1 month ago with good success so far. Congratulated patient and encouraged continued cessation.   Primary hypertension BP mildly elevated on repeat at 137/77.  Given fluctuating measurements that were taken with home blood pressure cuff, will initiate low-dose medication at this time. - Amlodipine 2.5 mg daily - Discussed return precautions and hypotension symptoms - Follow-up in about 4 weeks - Continue to check blood pressure about 1-2 times per day  TB lung, latent Previously treated with isoniazid   Flu vaccine administered today  Rise Patience, DO [place]

## 2021-10-16 NOTE — Patient Instructions (Addendum)
Your blood pressures is still a bit elevated, I am going to start you on a very small dose of a blood pressure medication. Continue to check your blood pressure 1-2 times per day and just make sure the medication doesn't make you dizzy.    We will follow-up in 4 weeks to re-check.    ?? ???? ??? ??? ??????? ??? ?????? ?????? ????? ????? ???? ?? ???? ???? ????. ????? ?? ??? ??? ??? 1-2 ???? ?????? ????? ?? ?? ?????? ?? ???? ?? ??????.  ????? ????????? ???? 4 ?????? ?????? ?????. la yazal daght damik mrtfean baed alshay'i, wasa'abda bijureat saghirat jdan min dawa'an lidaght aldum. aistamara fi fahs daght damik 1-2 maraat ywmyan wata'akad min 'ana aldawa' la yusabib lak aldawara. sanaqum bialmutabaeat khilal 4 'asabie li'iieadat alfahas.

## 2021-12-14 ENCOUNTER — Ambulatory Visit: Payer: Medicaid Other | Admitting: Family Medicine

## 2021-12-14 ENCOUNTER — Ambulatory Visit (HOSPITAL_COMMUNITY)
Admission: RE | Admit: 2021-12-14 | Discharge: 2021-12-14 | Disposition: A | Discharge status: Home / Self Care | Payer: Medicaid Other | Source: Ambulatory Visit | Attending: Family Medicine | Admitting: Family Medicine

## 2021-12-14 ENCOUNTER — Ambulatory Visit (INDEPENDENT_AMBULATORY_CARE_PROVIDER_SITE_OTHER): Payer: Self-pay | Admitting: Student

## 2021-12-14 VITALS — BP 142/98 | HR 79 | Ht 76.0 in | Wt 186.0 lb

## 2021-12-14 DIAGNOSIS — R0781 Pleurodynia: Secondary | ICD-10-CM

## 2021-12-14 MED ORDER — NAPROXEN 500 MG PO TABS: 500.0000 mg | ORAL_TABLET | Freq: Two times a day (BID) | ORAL | 0 refills | Status: AC

## 2021-12-14 NOTE — Patient Instructions (Signed)
It was great to see you! Thank you for allowing me to participate in your care!   Our plans for today:  - We will obtain a chest x ray - I believe this is most likely an upper respiratory infection like a costochondritis  Take care and seek immediate care sooner if you develop any concerns.  Levin Erp, MD

## 2021-12-14 NOTE — Progress Notes (Signed)
    SUBJECTIVE:   CHIEF COMPLAINT / HPI: Left sided rib pain  Pain over the left rib area. Has been going on for two weeks. Has had a cold with sore throat and been coughing with congestion for past 2 days. Has felt warm but has not checked for a fever. No vomiting or diarrhea. Sometimes has had small amount of blood when coughing up. He says whenever having cold symptoms he has this. Coughing up phlegm as well which he does not do at baseline. Smoking 2-3 cigarettes a day. Has smoked for 10 years. Pressing on his chest hurts and taking deep breaths hurts. No hx of PE/DVT per chart review. No chest pain. No trauma to area.  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  OBJECTIVE:   BP (!) 142/98   Pulse 79   Ht 6\' 4"  (1.93 m)   Wt 186 lb (84.4 kg)   SpO2 98%   BMI 22.64 kg/m   General: Well appearing, NAD, awake, alert, responsive to questions Head: Normocephalic atraumatic, oropharynx erythematous but without exudates or lesions CV: Regular rate and rhythm no murmurs rubs or gallops Chest: Clear to ausculation bilaterally, no wheezes rales or crackles, chest rises symmetrically,  no increased work of breathing, tenderness to palpation over left anterior chest wall and rib cage, no rash or bruising noted Abdomen: Soft, non-tender, non-distended, normoactive bowel sounds  Extremities: Moves upper and lower extremities freely ASSESSMENT/PLAN:   Rib pain on left side Most likely related to costochondritis/URI causing muscular pain given palpable chest wall tenderness near ribs. Has hx of latent TB that was treated but still possibility. PNA possible but less likely given no true fevers he knows of. PE less likely as Well's score was a 1 (hemoptysis).  -CXR given hx of latent TB -NSAID course sent -Return/ED precautions discussed   , MD [facility]

## 2021-12-14 NOTE — Assessment & Plan Note (Signed)
>>  ASSESSMENT AND PLAN FOR RIB PAIN ON LEFT SIDE WRITTEN ON 12/14/2021  6:03 PM BY Levin Erp, MD  Most likely related to costochondritis/URI causing muscular pain given palpable chest wall tenderness near ribs. Has hx of latent TB that was treated but still possibility. PNA possible but less likely given no true fevers he knows of. PE less likely as Well's score was a 1 (hemoptysis).  -CXR given hx of latent TB -NSAID course sent -Return/ED precautions discussed

## 2021-12-14 NOTE — Assessment & Plan Note (Signed)
Most likely related to costochondritis/URI causing muscular pain given palpable chest wall tenderness near ribs. Has hx of latent TB that was treated but still possibility. PNA possible but less likely given no true fevers he knows of. PE less likely as Well's score was a 1 (hemoptysis).  -CXR given hx of latent TB -NSAID course sent -Return/ED precautions discussed

## 2021-12-19 ENCOUNTER — Encounter: Payer: Self-pay | Admitting: Student

## 2021-12-19 ENCOUNTER — Telehealth: Payer: Self-pay | Admitting: Student

## 2021-12-19 NOTE — Telephone Encounter (Signed)
Used interpreter services and called patient. Left voicemail that imaging results were normal. Will send letter as well.

## 2022-01-03 ENCOUNTER — Encounter: Payer: Self-pay | Admitting: Student

## 2022-01-03 ENCOUNTER — Ambulatory Visit (INDEPENDENT_AMBULATORY_CARE_PROVIDER_SITE_OTHER): Payer: Medicaid Other | Admitting: Student

## 2022-01-03 VITALS — BP 134/88 | HR 86 | Ht 76.0 in | Wt 184.6 lb

## 2022-01-03 DIAGNOSIS — I10 Essential (primary) hypertension: Secondary | ICD-10-CM | POA: Diagnosis not present

## 2022-01-03 DIAGNOSIS — G8929 Other chronic pain: Secondary | ICD-10-CM

## 2022-01-03 DIAGNOSIS — M549 Dorsalgia, unspecified: Secondary | ICD-10-CM | POA: Diagnosis not present

## 2022-01-03 DIAGNOSIS — K219 Gastro-esophageal reflux disease without esophagitis: Secondary | ICD-10-CM | POA: Diagnosis not present

## 2022-01-03 MED ORDER — PANTOPRAZOLE SODIUM 40 MG PO TBEC: 40.0000 mg | DELAYED_RELEASE_TABLET | Freq: Every day | ORAL | 3 refills | Status: DC

## 2022-01-03 NOTE — Assessment & Plan Note (Signed)
Symptoms most consistent with GERD given worse at night when laying down after a large meal.  Abdominal exam benign today.  Question if underlying H. pylori.  He has not been on any PPI or H2 blocker, so obtain H. pylori breath test today. -Resume Protonix 40 mg daily, sent to pharmacy -Follow-up H. pylori breath test -Encourage patient to call [place] [facility] to get back in for a visit for possible EGD

## 2022-01-03 NOTE — Assessment & Plan Note (Addendum)
BP generally well-controlled.  134/88 today. -Continue amlodipine 2.5 mg at bedtime.  At follow-up in 1 month, if blood pressure still above goal 130/90, can increase to 5 mg daily. -Discussed return precautions

## 2022-01-03 NOTE — Patient Instructions (Signed)
Great seeing you today.  For your back pain, continue taking your medications as prescribed.  I placed a physical therapy referral to see if we can get coverage for you. You may use heat on your lower back muscles, which are very tight, for 10 to 15 minutes twice a day. Perform the stretches that I gave you twice daily for your back.  For your acid reflux, start taking Protonix daily. We collected testing today to see if you have a bacteria called H. pylori which can cause ulcers and worsening of your symptoms. Call your gastroenterologist for an appointment.  Best wishes, Dr. Melissa Noon

## 2022-01-03 NOTE — Assessment & Plan Note (Signed)
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  No acute changes or red flag signs or symptoms.Has had recent imaging with lumbar x-ray, no significant abnormalities. - Continue pregabalin 50 mg twice daily. - It is frustrating that physical therapy was not covered by insurance, but I have replaced a new referral to see if we are able to find somewhere that will accept his insurance as he would benefit greatly from this and he is motivated to perform PT. -Use ice or heat twice daily for 10 to 15 minutes -Provided with home stretches to use.

## 2022-01-03 NOTE — Progress Notes (Signed)
    SUBJECTIVE:   CHIEF COMPLAINT / HPI:   Shawn Stark is a 36 year old male here with stomach and abdominal pain.  Abdominal pain Mostly in the Epigastric region.  Pain is very severe at night, rated at 10-7. No nausea, vomiting, diarrhea. Has a well-known history of GERD, followed with [place] [facility].  In December 2022, they wanted to pursue endoscopy but the patient declined. He was started on Protonix 40 mg daily.  He is no longer taking this.    Back pain He has a history of chronic back pain for 10 years.  Mostly in the lumbar region.  Occasionally, the pain radiates all the way down to his feet when he is standing on his feet for long period of time.  Does not say the pain is better or worse in any position.  Has not been using ice or heat.  Has not been taking NSAIDs. 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  Also says he has pain in his other joints from using the machine so frequently. Takes Lyrica 50 mg twice daily.  Started baclofen in September 10 mg 3 times daily. No red flag signs or symptoms including bowel or bladder incontinence, saddle anesthesia, fevers or chills.   PERTINENT  PMH / PSH: Tobacco use, latent TB treated with isoniazid in the past  OBJECTIVE:   BP 134/88   Pulse 86   Ht 6\' 4"  (1.93 m)   Wt 184 lb 9.6 oz (83.7 kg)   SpO2 99%   BMI 22.47 kg/m    General: Well-appearing, no acute distress, well-nourished CV: Regular rate and rhythm Respiratory: Normal work of breathing on room air Abdomen: Soft, nontender nondistended.  Normal and active bowel sounds MSK: Inspection yields no bruising, redness or skin changes.  Tenderness to palpation of the paraspinal muscles, especially the quadratus lumborum.  No focal point spinal tenderness. Pain is worse with lumbar flexion, better with extension.   ASSESSMENT/PLAN:   Primary hypertension BP generally well-controlled.  134/88 today. -Continue amlodipine 2.5 mg at bedtime.  At follow-up in 1 month, if  blood pressure still above goal 130/90, can increase to 5 mg daily. 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  No acute changes or red flag signs or symptoms.Has had recent imaging with lumbar x-ray, no significant abnormalities. - Continue pregabalin 50 mg twice daily. - It is frustrating that physical therapy was not covered by insurance, but I have replaced a new referral to see if we are able to find somewhere that will accept his insurance as he would benefit greatly from this and he is motivated to perform PT. -Use ice or heat twice daily for 10 to 15 minutes -Provided with home stretches to use.    GERD (gastroesophageal reflux disease) Symptoms most consistent with GERD given worse at night when laying down after a large meal.  Abdominal exam benign today.  Question if underlying H. pylori.  He has not been on any PPI or H2 blocker, so obtain H. pylori breath test today. -Resume Protonix 40 mg daily, sent to pharmacy -Follow-up H. pylori breath test -Encourage patient to call [place] [facility] to get back in for a visit for possible EGD      , DO [facility]

## 2022-01-05 LAB — H. PYLORI BREATH TEST: H pylori Breath Test: NEGATIVE

## 2022-01-17 NOTE — Therapy (Signed)
OUTPATIENT PHYSICAL THERAPY THORACOLUMBAR EVALUATION   Patient Name: Shawn Stark MRN: [id number] DOB:1985/09/04, 36 y.o., male Today's Date: 01/19/2022  END OF SESSION:  PT End of Session - 01/19/22 0753     Visit Number 1    Number of Visits 9    Authorization Type [place] MEDICAID HE[employer]Y BLUE    PT Start Time 0802    PT Stop Time 0850    PT Time Calculation (min) 48 min    Activity Tolerance Patient tolerated treatment well    Behavior During Therapy [facility] for tasks assessed/performed             History reviewed. No pertinent past medical history. History reviewed. No pertinent surgical history. 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   Atypical chest pain 08/09/2020   Chronic back pain 08/09/2020   Tinea pedis of left foot 08/09/2020   History of tobacco use 08/09/2020   Constipation 08/09/2020   Encounter for health examination of refugee 08/05/2020    PCP: Evelena Leyden, DO  REFERRING PROVIDER: Billey Co, MD   REFERRING DIAG: [phone] (ICD-10-CM) - Chronic bilateral back pain, unspecified back location   Rationale for Evaluation and Treatment: Rehabilitation  THERAPY DIAG:  Chronic bilateral low back pain with bilateral sciatica  Muscle weakness (generalized)  Abnormal posture  ONSET DATE: approx 10 years  SUBJECTIVE:                                                                                                                                                                                           SUBJECTIVE STATEMENT: As a teenage reports he worked carrying heavy objects on his back which he thinks has caused his back pain.  PERTINENT HISTORY:   HTN  PAIN:  Are you having pain? Yes: NPRS scale: 8/10 Pain location: Lower neck to low back and intermittently to post/lat legs to feet with N/T  Pain description: Ache, at time sharp, constant Aggravating factors: prolonged standing, heavy lifting Relieving factors: Rest, pain meds and muscle relaxors On eval pain range 5-10/10  PRECAUTIONS: None  WEIGHT BEARING RESTRICTIONS: No  FALLS:  Has patient fallen in last 6 months? 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 No other abnormalities.  PATIENT SURVEYS:  NT due to language barrier and time constant of eval, assess next session  SCREENING FOR RED FLAGS: Bowel or bladder incontinence: No Spinal tumors: No Cauda equina syndrome: No Compression fracture: No   COGNITION: Overall cognitive status: Within functional limits for tasks assessed     SENSATION: WFL  MUSCLE LENGTH: Hamstrings: Right 45 deg; Left 45 deg Thomas test: Right NT deg; Left NT deg  POSTURE: increased lumbar lordosis and decreased thoracic kyphosis  PALPATION: TTP paraspinally from lower cervical to low back  LUMBAR ROM:   AROM eval  Flexion Mod limited, pulling LBP tight hamstrings and lordotic curve does not reverse  Extension Full, pressure LBP  Right lateral flexion Full, R pulling/pressure LBP   Left lateral flexion Full, R pulling LBP  Right rotation Full, sharp R LBP  Left rotation Full   (Blank rows = not tested)  LOWER EXTREMITY ROM:    WNLs and equal L and R Active  Right eval Left eval  Hip flexion    Hip extension    Hip abduction    Hip adduction     Hip internal rotation    Hip external rotation    Knee flexion    Knee extension    Ankle dorsiflexion    Ankle plantarflexion    Ankle inversion    Ankle eversion      LOWER EXTREMITY MMT:   Myotome screen WNLs and equal L and R MMT Right eval Left eval  Hip flexion    Hip extension    Hip abduction    Hip adduction    Hip internal rotation    Hip external rotation    Knee flexion    Knee extension    Ankle dorsiflexion    Ankle plantarflexion    Ankle inversion    Ankle eversion    Core Weak  bilat     LUMBAR SPECIAL TESTS:  Straight leg raise test: Negative, Slump test: Negative, SI Compression/distraction test: Negative, and FABER test: Negative  FUNCTIONAL TESTS:  5 times sit to stand: TBA  GAIT: Distance walked: [id number] Assistive device utilized: None Level of assistance: Complete Independence Comments: WNLs  TODAY'S TREATMENT:        [facility] Adult PT Treatment:                                                DATE: 01/18/22 Therapeutic Exercise: Developed, instructed in, and pt completed therex as noted in HEP                                                                                                                  PATIENT  EDUCATION:  Education details: Eval findings, POC, HEP Person educated: Patient Education method: Explanation, Demonstration, Tactile cues, Verbal cues, and Handouts Education comprehension: verbalized understanding, returned demonstration, verbal cues required, and tactile cues required  HOME EXERCISE PROGRAM: Access Code: [email or web address] URL: [email or web address] Date: 01/19/2022 Prepared by: Joellyn Rued  Exercises - Supine Lower Trunk Rotation  - 1-2 x daily - 7 x weekly - 1 sets - 5 reps - 5 hold - Hooklying Hamstring Stretch with Strap  - 1-2 x daily - 7 x weekly - 1 sets - 3 reps - 20 hold - Supine Double Knee to Chest  - 1-2 x daily - 7 x weekly - 1 sets - 10 hold - Supine Posterior Pelvic Tilt  - 1-2 x daily - 7  x weekly - 1 sets - 10 reps - 3 hold  ASSESSMENT:  CLINICAL IMPRESSION: Patient is a 36 y.o. male who was seen today for physical therapy evaluation and treatment for M54.9,G89.29 (ICD-10-CM) - Chronic bilateral back pain, unspecified back location. Pt presents with a Hx of chronic back pain with radicular symptoms. Forward flexion ROM is decreased with reduced mobility of the lumbar spine and tight hamstrings and pt's core strength is weak. With functional movements of ambulation, sit to and from standing, and sit to/from supine, pt completed these movements with good quality.   OBJECTIVE IMPAIRMENTS: decreased activity tolerance, decreased ROM, decreased strength, impaired flexibility, postural dysfunction, and pain.   ACTIVITY LIMITATIONS: lifting, bending, standing, and sleeping  PARTICIPATION LIMITATIONS: meal prep, cleaning, laundry, community activity, and occupation  PERSONAL FACTORS: Past/current experiences and Time since onset of injury/illness/exacerbation are also affecting patient's functional outcome.   REHAB POTENTIAL: Good  CLINICAL DECISION MAKING: Evolving/moderate complexity  EVALUATION COMPLEXITY: Moderate   GOALS:  SHORT TERM GOALS = LTGs  LONG TERM GOALS: Target date: 02/23/2022   Pt will be Ind in a final HEP to maintain achieved LOF  Baseline: initiated Goal status: INITIAL  2.  Pt will voice understanding of measures to assist in pain reduction  Baseline: HEP initiated Goal status: INITIAL  3.  Increase trunk forward bending Full ROM with improved flexion mobility of the lumbar spine and increases hamstring ROM to 60d bilat Baseline: See flow sheets Goal status: INITIAL  4.  Pt will demonstrate improved core strength per maintaining a plan from toes for 30 sec Baseline: decreased core strength with eval Goal status: INITIAL  5.  Improve 5xSTS by MCID of 5" as indication of improved functional mobility  Baseline: TBA Goal status: INITIAL  6.   Pt's Mod Oswestry score will improve by the MCID of 11 points as indication of improved function  Baseline: TBA Goal status: INITIAL  PLAN:  PT FREQUENCY: 2x/week  PT DURATION: 4 weeks  PLANNED INTERVENTIONS: Therapeutic exercises, Therapeutic activity, Patient/Family education, Self Care, Dry Needling, Electrical stimulation, Spinal manipulation, Spinal mobilization, Cryotherapy, Moist heat, Taping, Traction, Ultrasound, Ionotophoresis 4mg /ml Dexamethasone, Manual therapy, and Re-evaluation.  PLAN FOR NEXT SESSION: Complete Mod Oswestry; assess response to HEP; progress therex as indicated; use of modalities, manual therapy; and TPDN as indicated.  Mike Berntsen MS, PT 01/19/22 9:00 AM  Check all possible CPT codes: 01/21/22 - PT Re-evaluation, 97110- Therapeutic Exercise, 97140 - Manual Therapy, 97530 - Therapeutic Activities, 97535 - Self Care, [phone] - Mechanical traction, 62952 - Electrical stimulation (Manual), and [id number] - Ultrasound    Check all conditions that are expected to impact treatment: Musculoskeletal disorders   If treatment provided at initial evaluation, no treatment charged  due to lack of authorization.

## 2022-01-18 ENCOUNTER — Ambulatory Visit: Payer: Medicaid Other | Attending: Family Medicine

## 2022-01-18 DIAGNOSIS — M6281 Muscle weakness (generalized): Secondary | ICD-10-CM | POA: Diagnosis not present

## 2022-01-18 DIAGNOSIS — M549 Dorsalgia, unspecified: Secondary | ICD-10-CM | POA: Insufficient documentation

## 2022-01-18 DIAGNOSIS — M5441 Lumbago with sciatica, right side: Secondary | ICD-10-CM | POA: Insufficient documentation

## 2022-01-18 DIAGNOSIS — R293 Abnormal posture: Secondary | ICD-10-CM | POA: Diagnosis not present

## 2022-01-18 DIAGNOSIS — G8929 Other chronic pain: Secondary | ICD-10-CM | POA: Diagnosis not present

## 2022-01-18 DIAGNOSIS — M5442 Lumbago with sciatica, left side: Secondary | ICD-10-CM | POA: Diagnosis not present

## 2022-01-19 ENCOUNTER — Other Ambulatory Visit: Payer: Self-pay

## 2022-02-06 ENCOUNTER — Ambulatory Visit: Payer: Medicaid Other | Attending: Family Medicine | Admitting: Physical Therapy

## 2022-02-06 ENCOUNTER — Encounter: Payer: Self-pay | Admitting: Physical Therapy

## 2022-02-06 DIAGNOSIS — G8929 Other chronic pain: Secondary | ICD-10-CM

## 2022-02-06 DIAGNOSIS — M5442 Lumbago with sciatica, left side: Secondary | ICD-10-CM | POA: Diagnosis not present

## 2022-02-06 DIAGNOSIS — M6281 Muscle weakness (generalized): Secondary | ICD-10-CM | POA: Diagnosis not present

## 2022-02-06 DIAGNOSIS — M5441 Lumbago with sciatica, right side: Secondary | ICD-10-CM | POA: Diagnosis not present

## 2022-02-06 DIAGNOSIS — R293 Abnormal posture: Secondary | ICD-10-CM | POA: Diagnosis not present

## 2022-02-06 DIAGNOSIS — M5459 Other low back pain: Secondary | ICD-10-CM | POA: Insufficient documentation

## 2022-02-06 NOTE — Therapy (Signed)
OUTPATIENT PHYSICAL THERAPY TREATMENT NOTE   Patient Name: Shawn Stark MRN: [id number] DOB:05-05-1985, 37 y.o., male Today's Date: 02/06/2022  PCP: Rise Patience, DO   REFERRING PROVIDER: Lenoria Chime, MD  END OF SESSION:   PT End of Session - 02/06/22 0810     Visit Number 2    Number of Visits 9    Date for PT Re-Evaluation 02/23/22    Authorization Type [place] MEDICAID HEALTHY BLUE    Progress Note Due on Visit 9    PT Start Time 0806    PT Stop Time 0845    PT Time Calculation (min) 39 min             History reviewed. No pertinent past medical history. History reviewed. No pertinent surgical history. 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   Atypical chest pain 08/09/2020   Chronic back pain 08/09/2020   Tinea pedis of left foot 08/09/2020   History of tobacco use 08/09/2020   Constipation 08/09/2020   Encounter for health examination of refugee 08/05/2020    REFERRING DIAG: M54.9,G89.29 (ICD-10-CM) - Chronic bilateral back pain, unspecified back location   THERAPY DIAG:  Chronic bilateral low back pain with bilateral sciatica  Muscle weakness (generalized)  Abnormal posture  Other low back pain  Rationale for Evaluation and Treatment Rehabilitation  PERTINENT HISTORY: PERTINENT HISTORY:  HTN     PRECAUTIONS: None   SUBJECTIVE:                                                                                                                                                                                      SUBJECTIVE STATEMENT:  I am doing the exercises. They help.    PAIN:  Are you having pain? 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 No other abnormalities.   PATIENT SURVEYS:  NT due to language barrier and time constant of eval, assess next session  02/06/22 : Modified Oswestry: 5/50 or  10% disability     SCREENING FOR RED FLAGS: Bowel or bladder incontinence: No Spinal tumors: No Cauda equina syndrome: No Compression fracture: No     COGNITION: Overall cognitive status: Within functional limits for tasks assessed                          SENSATION: WFL   MUSCLE LENGTH: Hamstrings: Right 45 deg; Left 45 deg Thomas test: Right NT deg; Left NT deg   POSTURE: increased lumbar lordosis and decreased thoracic kyphosis   PALPATION: TTP paraspinally from lower cervical to low back   LUMBAR ROM:    AROM eval  Flexion Mod limited, pulling LBP tight hamstrings and lordotic curve does not reverse  Extension Full, pressure LBP  Right lateral flexion Full, R pulling/pressure LBP   Left lateral flexion Full, R pulling LBP  Right rotation Full, sharp R LBP  Left rotation Full   (Blank rows = not tested)   LOWER EXTREMITY ROM:    WNLs and equal L and R Active  Right eval Left eval  Hip flexion      Hip extension      Hip abduction      Hip adduction      Hip internal rotation      Hip external rotation      Knee flexion      Knee extension      Ankle dorsiflexion      Ankle plantarflexion      Ankle inversion      Ankle eversion          LOWER EXTREMITY MMT:   Myotome screen WNLs and equal L and R MMT Right eval Left eval  Hip flexion      Hip  extension      Hip abduction      Hip adduction      Hip internal rotation      Hip external rotation      Knee flexion      Knee extension      Ankle dorsiflexion      Ankle plantarflexion      Ankle inversion      Ankle eversion      Core Weak  bilat      LUMBAR SPECIAL TESTS:  Straight leg raise test: Negative, Slump test: Negative, SI Compression/distraction test: Negative, and FABER test: Negative   FUNCTIONAL TESTS:  5 times sit to stand: 02/06/22: 14.7 Sec    GAIT: Distance walked: 231ft Assistive device utilized: None Level of assistance: Complete Independence Comments: WNLs   TODAY'S TREATMENT:     [facility] Adult PT Treatment:                                                DATE: 02/06/22 Therapeutic Exercise: Cat/camel Childs pose to qped x 5 SKTC X 3 each  Supine hamstring stretch with strap  x 3 each  DKTC PPT x 10 min cues LTR  Therapeutic Activity: 5 x STS 14.6 sec without UE ODI  5/50 or 10% disability     [facility] Adult PT Treatment:  DATE: 01/18/22 Therapeutic Exercise: Developed, instructed in, and pt completed therex as noted in HEP                                                                                                                  PATIENT EDUCATION:  Education details: Eval findings, POC, HEP Person educated: Patient Education method: Explanation, Demonstration, Tactile cues, Verbal cues, and Handouts Education comprehension: verbalized understanding, returned demonstration, verbal cues required, and tactile cues required   HOME EXERCISE PROGRAM: Access Code: [email or web address] URL: [email or web address] Date: 01/19/2022 Prepared by: Joellyn Rued   Exercises - Supine Lower Trunk Rotation  - 1-2 x daily - 7 x weekly - 1 sets - 5 reps - 5 hold - Hooklying Hamstring Stretch with Strap  - 1-2 x daily - 7 x weekly - 1 sets - 3 reps - 20 hold - Supine Double Knee to Chest  - 1-2 x daily - 7 x  weekly - 1 sets - 10 hold - Supine Posterior Pelvic Tilt  - 1-2 x daily - 7 x weekly - 1 sets - 10 reps - 3 hold Added - Cat Cow to Child's Pose  - 1 x daily - 7 x weekly - 1 sets - 5 reps - 20 hold   ASSESSMENT:   CLINICAL IMPRESSION: Patient is a 37 y.o. male who was seen today for physical therapy treatment for M54.9,G89.29 (ICD-10-CM) - Chronic bilateral back pain, unspecified back location. Pt presents with a Hx of chronic back pain with radicular symptoms. He reports compliance with HEP and reports a lower level of pain today. Reviewed HEP and worked on thoracolumbar mobility in quadruped. Captured his ODI survey at 10% disability. He reports most difficulty with prolonged standing and walking. Progressed HEP with Qped mobility. At end of session he reported feeing better.     OBJECTIVE IMPAIRMENTS: decreased activity tolerance, decreased ROM, decreased strength, impaired flexibility, postural dysfunction, and pain.    ACTIVITY LIMITATIONS: lifting, bending, standing, and sleeping   PARTICIPATION LIMITATIONS: meal prep, cleaning, laundry, community activity, and occupation   PERSONAL FACTORS: Past/current experiences and Time since onset of injury/illness/exacerbation are also affecting patient's functional outcome.    REHAB POTENTIAL: Good   CLINICAL DECISION MAKING: Evolving/moderate complexity   EVALUATION COMPLEXITY: Moderate     GOALS:   SHORT TERM GOALS = LTGs   LONG TERM GOALS: Target date: 02/23/2022    Pt will be Ind in a final HEP to maintain achieved LOF  Baseline: initiated Goal status: INITIAL   2.  Pt will voice understanding of measures to assist in pain reduction  Baseline: HEP initiated Goal status: INITIAL   3.  Increase trunk forward bending Full ROM with improved flexion mobility of the lumbar spine and increases hamstring ROM to 60d bilat Baseline: See flow sheets Goal status: INITIAL   4.  Pt will demonstrate improved core strength per  maintaining a plan from toes for 30 sec Baseline: decreased core strength with eval Goal status: INITIAL   5.  Improve  5xSTS by MCID of 5" as indication of improved functional mobility  Baseline: 02/06/22: 14.6 sec Goal status: INITIAL   6.  Pt's Mod Oswestry score will improve by the MCID of 11 points as indication of improved function  Baseline: 02/06/22: 5/50 or 10% disability Goal status: INITIAL   PLAN:   PT FREQUENCY: 2x/week   PT DURATION: 4 weeks   PLANNED INTERVENTIONS: Therapeutic exercises, Therapeutic activity, Patient/Family education, Self Care, Dry Needling, Electrical stimulation, Spinal manipulation, Spinal mobilization, Cryotherapy, Moist heat, Taping, Traction, Ultrasound, Ionotophoresis 4mg /ml Dexamethasone, Manual therapy, and Re-evaluation.   PLAN FOR NEXT SESSION:  assess response to HEP; progress therex as indicated; use of modalities, manual therapy; and TPDN as indicated.     Hessie Diener, PTA 02/06/22 12:27 PM Phone: [phone] Fax: [phone]

## 2022-02-08 ENCOUNTER — Encounter: Payer: Self-pay | Admitting: Physical Therapy

## 2022-02-08 ENCOUNTER — Ambulatory Visit: Payer: Medicaid Other | Admitting: Physical Therapy

## 2022-02-08 DIAGNOSIS — M5442 Lumbago with sciatica, left side: Secondary | ICD-10-CM | POA: Diagnosis not present

## 2022-02-08 DIAGNOSIS — M5459 Other low back pain: Secondary | ICD-10-CM | POA: Diagnosis not present

## 2022-02-08 DIAGNOSIS — M6281 Muscle weakness (generalized): Secondary | ICD-10-CM

## 2022-02-08 DIAGNOSIS — G8929 Other chronic pain: Secondary | ICD-10-CM

## 2022-02-08 DIAGNOSIS — M5441 Lumbago with sciatica, right side: Secondary | ICD-10-CM | POA: Diagnosis not present

## 2022-02-08 DIAGNOSIS — R293 Abnormal posture: Secondary | ICD-10-CM

## 2022-02-08 NOTE — Therapy (Signed)
OUTPATIENT PHYSICAL THERAPY TREATMENT NOTE   Patient Name: Shawn Stark MRN: [id number] DOB:09/20/1985, 37 y.o., male 58 Date: 02/08/2022  PCP: Shawn Patience, DO   REFERRING PROVIDER: Lenoria Chime, MD  END OF SESSION:   PT End of Session - 02/08/22 0759     Visit Number 3    Number of Visits 9    Date for PT Re-Evaluation 02/23/22    Authorization Type [place] MEDICAID HEALTHY BLUE    Progress Note Due on Visit 9    PT Start Time 0800   d/t scheduling error pt seen for only 30 min   PT Stop Time 0826    PT Time Calculation (min) 26 min             History reviewed. No pertinent past medical history. History reviewed. No pertinent surgical history. 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   Atypical chest pain 08/09/2020   Chronic back pain 08/09/2020   Tinea pedis of left foot 08/09/2020   History of tobacco use 08/09/2020   Constipation 08/09/2020   Encounter for health examination of refugee 08/05/2020    REFERRING DIAG: M54.9,G89.29 (ICD-10-CM) - Chronic bilateral back pain, unspecified back location   THERAPY DIAG:  Chronic bilateral low back pain with bilateral sciatica  Muscle weakness (generalized)  Abnormal posture  Other low back pain  Rationale for Evaluation and Treatment Rehabilitation  PERTINENT HISTORY: PERTINENT HISTORY:  HTN     PRECAUTIONS: None   SUBJECTIVE:                                                                                                                                                                                      SUBJECTIVE STATEMENT:  Pt reports that he feels the exercises have been helpful.  He reports "a little" back pain  this morning.    PAIN:  Are you having pain? 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 No other abnormalities.   PATIENT  SURVEYS:  NT due to language barrier and time constant of eval, assess next session  02/06/22 : Modified Oswestry: 5/50 or 10% disability     SCREENING FOR RED FLAGS: Bowel or bladder incontinence: No Spinal tumors: No Cauda equina syndrome: No Compression fracture: No     COGNITION: Overall cognitive status: Within functional limits for tasks assessed                          SENSATION: WFL   MUSCLE LENGTH: Hamstrings: Right 45 deg; Left 45 deg Thomas test: Right NT deg; Left NT deg   POSTURE: increased lumbar lordosis and decreased thoracic kyphosis   PALPATION: TTP paraspinally from lower cervical to low back   LUMBAR ROM:    AROM eval  Flexion Mod limited, pulling LBP tight hamstrings and lordotic curve does not reverse  Extension Full, pressure LBP  Right lateral flexion Full, R pulling/pressure LBP   Left lateral flexion Full, R pulling LBP  Right rotation Full, sharp R LBP  Left rotation Full   (Blank rows = not tested)   LOWER EXTREMITY ROM:    WNLs and equal L and R Active  Right eval Left eval  Hip flexion      Hip extension      Hip abduction      Hip adduction      Hip internal rotation      Hip external rotation      Knee flexion      Knee extension      Ankle dorsiflexion      Ankle plantarflexion      Ankle inversion      Ankle eversion           LOWER EXTREMITY MMT:   Myotome screen WNLs and equal L and R MMT Right eval Left eval  Hip flexion      Hip extension      Hip abduction      Hip adduction      Hip internal rotation      Hip external rotation      Knee flexion      Knee extension      Ankle dorsiflexion      Ankle plantarflexion      Ankle inversion      Ankle eversion      Core Weak  bilat      LUMBAR SPECIAL TESTS:  Straight leg raise test: Negative, Slump test: Negative, SI Compression/distraction test: Negative, and FABER test: Negative   FUNCTIONAL TESTS:  5 times sit to stand: 02/06/22: 14.7 Sec    GAIT: Distance walked: 211ft Assistive device utilized: None Level of assistance: Complete Independence Comments: WNLs   TODAY'S TREATMENT:     [facility] Adult PT Treatment:                                                DATE: 02/08/22 Therapeutic Exercise: Bike - 2.5' for warm up Cat/camel Childs pose to qped x 5 SKTC X 3 each  DKTC x3 Sciatic nerve glide - 20x ea LTR Bridge - 2x10 Abdominal isometric squeeze with swiss ball - 30x Alternating clam - GTB - 2x10 ea    [facility] Adult PT Treatment:  DATE: 01/18/22 Therapeutic Exercise: Developed, instructed in, and pt completed therex as noted in HEP                                                                                                                  PATIENT EDUCATION:  Education details: Eval findings, POC, HEP Person educated: Patient Education method: Explanation, Demonstration, Tactile cues, Verbal cues, and Handouts Education comprehension: verbalized understanding, returned demonstration, verbal cues required, and tactile cues required   HOME EXERCISE PROGRAM: Access Code: [email or web address] URL: [email or web address] Date: 01/19/2022 Prepared by: Shawn Stark   Exercises - Supine Lower Trunk Rotation  - 1-2 x daily - 7 x weekly - 1 sets - 5 reps - 5 hold - Hooklying Hamstring  Stretch with Strap  - 1-2 x daily - 7 x weekly - 1 sets - 3 reps - 20 hold - Supine Double Knee to Chest  - 1-2 x daily - 7 x weekly - 1 sets - 10 hold - Supine Posterior Pelvic Tilt  - 1-2 x daily - 7 x weekly - 1 sets - 10 reps - 3 hold Added - Cat Cow to Child's Pose  - 1 x daily - 7 x weekly - 1 sets - 5 reps - 20 hold   ASSESSMENT:   CLINICAL IMPRESSION: [name] tolerated session well with no adverse reaction.  Seems to be responding well to therex and stretching.  Added in more core and hip strengthening vs stretching today to good effect.  Continue per POC.    OBJECTIVE IMPAIRMENTS: decreased activity tolerance, decreased ROM, decreased strength, impaired flexibility, postural dysfunction, and pain.    ACTIVITY LIMITATIONS: lifting, bending, standing, and sleeping   PARTICIPATION LIMITATIONS: meal prep, cleaning, laundry, community activity, and occupation   PERSONAL FACTORS: Past/current experiences and Time since onset of injury/illness/exacerbation are also affecting patient's functional outcome.    REHAB POTENTIAL: Good   CLINICAL DECISION MAKING: Evolving/moderate complexity   EVALUATION COMPLEXITY: Moderate     GOALS:   SHORT TERM GOALS = LTGs   LONG TERM GOALS: Target date: 02/23/2022    Pt will be Ind in a final HEP to maintain achieved LOF  Baseline: initiated Goal status: INITIAL   2.  Pt will voice understanding of measures to assist in pain reduction  Baseline: HEP initiated Goal status: INITIAL   3.  Increase trunk forward bending Full ROM with improved flexion mobility of the lumbar spine and increases hamstring ROM to 60d bilat Baseline: See flow sheets Goal status: INITIAL   4.  Pt will demonstrate improved core strength per maintaining a plan from toes for 30 sec Baseline: decreased core strength with eval Goal status: INITIAL   5.  Improve 5xSTS by MCID of 5" as indication of improved functional mobility  Baseline: 02/06/22: 14.6 sec Goal status:  INITIAL   6.  Pt's Mod Oswestry score will improve by the MCID of 11 points as indication of improved function  Baseline: 02/06/22: 5/50 or 10% disability Goal status: INITIAL   PLAN:  PT FREQUENCY: 2x/week   PT DURATION: 4 weeks   PLANNED INTERVENTIONS: Therapeutic exercises, Therapeutic activity, Patient/Family education, Self Care, Dry Needling, Electrical stimulation, Spinal manipulation, Spinal mobilization, Cryotherapy, Moist heat, Taping, Traction, Ultrasound, Ionotophoresis 4mg /ml Dexamethasone, Manual therapy, and Re-evaluation.   PLAN FOR NEXT SESSION:  assess response to HEP; progress therex as indicated; use of modalities, manual therapy; and TPDN as indicated.     Traniece Boffa PT 02/08/22 8:53 AM Phone: [phone] Fax: [phone]

## 2022-02-13 ENCOUNTER — Encounter: Payer: Self-pay | Admitting: Physical Therapy

## 2022-02-13 ENCOUNTER — Ambulatory Visit: Payer: Medicaid Other | Admitting: Physical Therapy

## 2022-02-13 DIAGNOSIS — G8929 Other chronic pain: Secondary | ICD-10-CM

## 2022-02-13 DIAGNOSIS — M5459 Other low back pain: Secondary | ICD-10-CM | POA: Diagnosis not present

## 2022-02-13 DIAGNOSIS — R293 Abnormal posture: Secondary | ICD-10-CM | POA: Diagnosis not present

## 2022-02-13 DIAGNOSIS — M5441 Lumbago with sciatica, right side: Secondary | ICD-10-CM | POA: Diagnosis not present

## 2022-02-13 DIAGNOSIS — M6281 Muscle weakness (generalized): Secondary | ICD-10-CM | POA: Diagnosis not present

## 2022-02-13 DIAGNOSIS — M5442 Lumbago with sciatica, left side: Secondary | ICD-10-CM | POA: Diagnosis not present

## 2022-02-13 NOTE — Therapy (Signed)
OUTPATIENT PHYSICAL THERAPY TREATMENT NOTE   Patient Name: Shawn Stark MRN: [id number] DOB:[birth year], 37 y.o., male 6 Date: 02/13/2022  PCP: Rise Patience, DO   REFERRING PROVIDER: Lenoria Chime, MD  END OF SESSION:   PT End of Session - 02/13/22 0804     Visit Number 4    Number of Visits 9    Date for PT Re-Evaluation 02/23/22    Authorization Type [place] - Visit Number 3    Authorization - Number of Visits 4    PT Start Time 0803    PT Stop Time 0845    PT Time Calculation (min) 42 min             History reviewed. No pertinent past medical history. History reviewed. No pertinent surgical history. 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   Atypical chest pain 08/09/2020   Chronic back pain 08/09/2020   Tinea pedis of left foot 08/09/2020   History of tobacco use 08/09/2020   Constipation 08/09/2020   Encounter for health examination of refugee 08/05/2020    REFERRING DIAG: M54.9,G89.29 (ICD-10-CM) - Chronic bilateral back pain, unspecified back location   THERAPY DIAG:  Chronic bilateral low back pain with bilateral sciatica  Abnormal posture  Muscle weakness (generalized)  Rationale for Evaluation and Treatment Rehabilitation  PERTINENT HISTORY: PERTINENT HISTORY:  HTN     PRECAUTIONS: None   SUBJECTIVE:                                                                                                                                                                                      SUBJECTIVE STATEMENT:  The pain in the legs is better than before. The pain is mild on the backs of the thighs now.    PAIN:  Are you  having pain? 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 No other abnormalities.   PATIENT SURVEYS:  NT due to language  barrier and time constant of eval, assess next session  02/06/22 : Modified Oswestry: 5/50 or 10% disability     SCREENING FOR RED FLAGS: Bowel or bladder incontinence: No Spinal tumors: No Cauda equina syndrome: No Compression fracture: No     COGNITION: Overall cognitive status: Within functional limits for tasks assessed                          SENSATION: WFL   MUSCLE LENGTH: Hamstrings: Right 45 deg; Left 45 deg Thomas test: Right NT deg; Left NT deg   POSTURE: increased lumbar lordosis and decreased thoracic kyphosis   PALPATION: TTP paraspinally from lower cervical to low back   LUMBAR ROM:    AROM eval  Flexion Mod limited, pulling LBP tight hamstrings and lordotic curve does not reverse  Extension Full, pressure LBP  Right lateral flexion Full, R pulling/pressure LBP   Left lateral flexion Full, R pulling LBP  Right rotation Full, sharp R LBP  Left rotation Full   (Blank rows = not tested)   LOWER EXTREMITY ROM:    WNLs and equal L and R Active  Right eval Left eval  Hip flexion      Hip extension      Hip abduction      Hip adduction      Hip internal rotation      Hip external rotation      Knee flexion      Knee extension      Ankle dorsiflexion      Ankle plantarflexion      Ankle inversion      Ankle eversion          LOWER EXTREMITY MMT:   Myotome screen  WNLs and equal L and R MMT Right eval Left eval  Hip flexion      Hip extension      Hip abduction      Hip adduction      Hip internal rotation      Hip external rotation      Knee flexion      Knee extension      Ankle dorsiflexion      Ankle plantarflexion      Ankle inversion      Ankle eversion      Core Weak  bilat      LUMBAR SPECIAL TESTS:  Straight leg raise test: Negative, Slump test: Negative, SI Compression/distraction test: Negative, and FABER test: Negative   FUNCTIONAL TESTS:  5 times sit to stand: 02/06/22: 14.7 Sec    GAIT: Distance walked: 226ft Assistive device utilized: None Level of assistance: Complete Independence Comments: WNLs   TODAY'S TREATMENT:     [facility] Adult PT Treatment:                                                DATE: 02/13/22 Therapeutic Exercise: Rec bike L3 x 5 minutes  Cat/camel Bird dogs 5 sec x 5- cues for ab draw in [place] pose to qped x 5 SKTC X 3 each  DKTC x3 Sciatic nerve glide - 20x ea Hamstring stretch with strap  LTR Blue band clam supine Bridge - 2x10, blue band  Abdominal isometric squeeze with swiss ball - 30x   [facility] Adult PT Treatment:  DATE: 02/08/22 Therapeutic Exercise: Bike - 2.5' for warm up Cat/camel Childs pose to qped x 5 SKTC X 3 each  DKTC x3 Sciatic nerve glide - 20x ea LTR Bridge - 2x10 Abdominal isometric squeeze with swiss ball - 30x Alternating clam - GTB - 2x10 ea    [facility] Adult PT Treatment:                                                DATE: 02/06/22 Therapeutic Exercise: Cat/camel Childs pose to qped x 5 SKTC X 3 each  Supine hamstring stretch with strap  x 3 each  DKTC PPT x 10 min cues LTR   Therapeutic Activity: 5 x STS 14.6 sec without UE ODI  5/50 or 10% disability   [facility] Adult PT Treatment:                                                DATE: 01/18/22 Therapeutic Exercise: Developed, instructed in, and pt completed therex  as noted in HEP                                                                                                                  PATIENT EDUCATION:  Education details: Eval findings, POC, HEP Person educated: Patient Education method: Explanation, Demonstration, Tactile cues, Verbal cues, and Handouts Education comprehension: verbalized understanding, returned demonstration, verbal cues required, and tactile cues required   HOME EXERCISE PROGRAM: Access Code: [email or web address] URL: [email or web address] Date: 01/19/2022 Prepared by: Joellyn Rued   Exercises - Supine Lower Trunk Rotation  - 1-2 x daily - 7 x weekly - 1 sets - 5 reps - 5 hold - Hooklying Hamstring Stretch with Strap  - 1-2 x daily - 7 x weekly - 1 sets - 3 reps - 20 hold - Supine Double Knee to Chest  - 1-2 x daily - 7 x weekly - 1 sets - 10 hold - Supine Posterior Pelvic Tilt  - 1-2 x daily - 7 x weekly - 1 sets - 10 reps - 3 hold Added - Cat Cow to Child's Pose  - 1 x daily - 7 x weekly - 1 sets - 5 reps - 20 hold   ASSESSMENT:   CLINICAL IMPRESSION: Miquan tolerated session well with no adverse reaction.  Seems to be responding well to therex and stretching reporting low level pain and decreased radicular symptoms.  Added in more core and hip strengthening vs stretching today to good effect.  Continue per POC.     OBJECTIVE IMPAIRMENTS: decreased activity tolerance, decreased ROM, decreased strength, impaired flexibility, postural dysfunction, and pain.    ACTIVITY LIMITATIONS: lifting, bending, standing, and sleeping   PARTICIPATION LIMITATIONS: meal prep, cleaning, laundry, community activity, and occupation   PERSONAL  FACTORS: Past/current experiences and Time since onset of injury/illness/exacerbation are also affecting patient's functional outcome.    REHAB POTENTIAL: Good   CLINICAL DECISION MAKING: Evolving/moderate complexity   EVALUATION COMPLEXITY: Moderate     GOALS:   SHORT TERM GOALS =  LTGs   LONG TERM GOALS: Target date: 02/23/2022    Pt will be Ind in a final HEP to maintain achieved LOF  Baseline: initiated Goal status: INITIAL   2.  Pt will voice understanding of measures to assist in pain reduction  Baseline: HEP initiated Goal status: INITIAL   3.  Increase trunk forward bending Full ROM with improved flexion mobility of the lumbar spine and increases hamstring ROM to 60d bilat Baseline: See flow sheets Goal status: INITIAL   4.  Pt will demonstrate improved core strength per maintaining a plan from toes for 30 sec Baseline: decreased core strength with eval Goal status: INITIAL   5.  Improve 5xSTS by MCID of 5" as indication of improved functional mobility  Baseline: 02/06/22: 14.6 sec Goal status: INITIAL   6.  Pt's Mod Oswestry score will improve by the MCID of 11 points as indication of improved function  Baseline: 02/06/22: 5/50 or 10% disability Goal status: INITIAL   PLAN:   PT FREQUENCY: 2x/week   PT DURATION: 4 weeks   PLANNED INTERVENTIONS: Therapeutic exercises, Therapeutic activity, Patient/Family education, Self Care, Dry Needling, Electrical stimulation, Spinal manipulation, Spinal mobilization, Cryotherapy, Moist heat, Taping, Traction, Ultrasound, Ionotophoresis 4mg /ml Dexamethasone, Manual therapy, and Re-evaluation.   PLAN FOR NEXT SESSION:  assess response to HEP; progress therex as indicated; use of modalities, manual therapy; and TPDN as indicated. Check goals, request visits.      Deon Pilling PTA 02/13/22 8:48 AM Phone: [phone] Fax: [phone]

## 2022-02-15 ENCOUNTER — Ambulatory Visit: Payer: Medicaid Other | Admitting: Physical Therapy

## 2022-02-15 ENCOUNTER — Encounter: Payer: Self-pay | Admitting: Physical Therapy

## 2022-02-15 DIAGNOSIS — M5459 Other low back pain: Secondary | ICD-10-CM

## 2022-02-15 DIAGNOSIS — G8929 Other chronic pain: Secondary | ICD-10-CM

## 2022-02-15 DIAGNOSIS — M5442 Lumbago with sciatica, left side: Secondary | ICD-10-CM | POA: Diagnosis not present

## 2022-02-15 DIAGNOSIS — R293 Abnormal posture: Secondary | ICD-10-CM

## 2022-02-15 DIAGNOSIS — M6281 Muscle weakness (generalized): Secondary | ICD-10-CM | POA: Diagnosis not present

## 2022-02-15 DIAGNOSIS — M5441 Lumbago with sciatica, right side: Secondary | ICD-10-CM | POA: Diagnosis not present

## 2022-02-15 NOTE — Therapy (Signed)
OUTPATIENT PHYSICAL THERAPY TREATMENT NOTE   Patient Name: Shawn Stark MRN: [id number] DOB:1985/02/05, 37 y.o., male Today's Date: 02/15/2022  PCP: Rise Patience, DO   REFERRING PROVIDER: Lenoria Chime, MD  END OF SESSION:   PT End of Session - 02/15/22 0803     Visit Number 5    Number of Visits 9    Date for PT Re-Evaluation 02/23/22    Authorization Type [place] - Visit Number 4    Authorization - Number of Visits 4    PT Start Time 0803    PT Stop Time 4098    PT Time Calculation (min) 41 min             History reviewed. No pertinent past medical history. History reviewed. No pertinent surgical history. 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   Atypical chest pain 08/09/2020   Chronic back pain 08/09/2020   Tinea pedis of left foot 08/09/2020   History of tobacco use 08/09/2020   Constipation 08/09/2020   Encounter for health examination of refugee 08/05/2020    REFERRING DIAG: M54.9,G89.29 (ICD-10-CM) - Chronic bilateral back pain, unspecified back location   THERAPY DIAG:  Chronic bilateral low back pain with bilateral sciatica  Abnormal posture  Muscle weakness (generalized)  Other low back pain  Rationale for Evaluation and Treatment Rehabilitation  PERTINENT HISTORY: PERTINENT HISTORY:  HTN     PRECAUTIONS: None   SUBJECTIVE:                                                                                                                                                                                      SUBJECTIVE STATEMENT:  Did well after last visit. I have a slight pain in my spine and back of thighs. 2/10 pain.    PAIN:  Are you having pain? 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 No other abnormalities.   PATIENT SURVEYS:  NT due to language barrier and time constant of eval,  assess next session  02/06/22 : Modified Oswestry: 5/50 or 10% disability    02/15/22: Modified Oswestry: 5/50 or 10% disability   SCREENING FOR RED FLAGS: Bowel or bladder incontinence: No Spinal tumors: No Cauda equina syndrome: No Compression fracture: No     COGNITION: Overall cognitive status: Within functional limits for tasks assessed                          SENSATION: WFL   MUSCLE LENGTH: Hamstrings: Right 45 deg; Left 45 deg: 02/15/22: Left 55 degrees: Right 58 degrees Thomas test: Right NT deg; Left NT deg   POSTURE: increased lumbar lordosis and decreased thoracic kyphosis   PALPATION: TTP paraspinally from lower cervical to low back   LUMBAR ROM:    AROM eval 02/15/22  Flexion Mod limited, pulling LBP tight hamstrings and lordotic curve does not reverse Min limited, reaches toes, improved lumbar flexion/lordosis, min pulling in LBP and hamstrings  Extension Full, pressure LBP Full, min pain  Right lateral flexion Full, R pulling/pressure LBP  Full, No pain  Left lateral flexion Full, R pulling LBP Full, No pain  Right rotation Full, sharp R LBP Full, no pain  Left rotation Full Full, no pain   (Blank rows = not tested)   LOWER EXTREMITY ROM:    WNLs and equal L and R Active  Right eval Left eval  Hip flexion      Hip extension      Hip abduction      Hip  adduction      Hip internal rotation      Hip external rotation      Knee flexion      Knee extension      Ankle dorsiflexion      Ankle plantarflexion      Ankle inversion      Ankle eversion          LOWER EXTREMITY MMT:   Myotome screen WNLs and equal L and R MMT Right eval Left eval  Hip flexion      Hip extension      Hip abduction      Hip adduction      Hip internal rotation      Hip external rotation      Knee flexion      Knee extension      Ankle dorsiflexion      Ankle plantarflexion      Ankle inversion      Ankle eversion      Core Weak  bilat      LUMBAR SPECIAL TESTS:  Straight leg raise test: Negative, Slump test: Negative, SI Compression/distraction test: Negative, and FABER test: Negative   FUNCTIONAL TESTS:  5 times sit to stand: 02/06/22: 14.7 Sec : 02/15/22: 8.5 sec    GAIT: Distance walked: 219ft Assistive device utilized: None Level of assistance: Complete Independence Comments: WNLs   TODAY'S TREATMENT:     [facility] Adult PT Treatment:                                                DATE: 02/15/22 Therapeutic Exercise: Rec Bike L3 x 5 minutes  AROM lumbar flexion, extension, Side bending and rotation bilateral.  Plank, 25 sec, 20 sec - c/o back pain after 20-25 sec  90/90 ab brace  5-10 sec holds x 10 DKTC x 60 sec  Hamstring stretch with strap x 3 each  AROM lumbar flexion, extension, Side bending and rotation bilateral.    [facility] Adult PT Treatment:                                                DATE: 02/13/22 Therapeutic Exercise: Rec bike L3 x 5 minutes  Cat/camel Bird dogs 5 sec x 5- cues for ab draw in [place] pose to qped x 5 SKTC X 3 each  DKTC x3 Sciatic nerve glide - 20x ea Hamstring stretch with strap  LTR Blue band clam supine Bridge - 2x10, blue band  Abdominal isometric squeeze with swiss ball - 30x   [facility] Adult PT Treatment:                                                DATE: 02/08/22 Therapeutic Exercise: Bike - 2.5'  for warm up Cat/camel Childs pose to qped x 5 SKTC X 3 each  DKTC x3 Sciatic nerve glide - 20x ea LTR Bridge - 2x10 Abdominal isometric squeeze with swiss ball - 30x Alternating clam - GTB - 2x10 ea    [facility] Adult PT Treatment:                                                DATE: 02/06/22 Therapeutic Exercise: Cat/camel Childs pose to qped x 5 SKTC X 3 each  Supine hamstring stretch with strap  x 3 each  DKTC PPT x 10 min cues LTR   Therapeutic Activity: 5 x STS 14.6 sec without UE ODI  5/50 or 10% disability   [facility] Adult PT Treatment:                                                DATE: 01/18/22 Therapeutic Exercise: Developed, instructed in, and pt completed therex as noted in HEP                                                                                                                  PATIENT EDUCATION:  Education details: Eval findings, POC, HEP Person educated: Patient Education method: Explanation, Demonstration, Tactile cues, Verbal cues, and Handouts Education comprehension: verbalized understanding, returned demonstration, verbal cues required, and tactile cues required   HOME EXERCISE PROGRAM: Access Code: [email or web address] URL: [email or web address] Date: 01/19/2022 Prepared by: Gar Ponto   Exercises - Supine Lower Trunk Rotation  - 1-2 x daily -  7 x weekly - 1 sets - 5 reps - 5 hold - Hooklying Hamstring Stretch with Strap  - 1-2 x daily - 7 x weekly - 1 sets - 3 reps - 20 hold - Supine Double Knee to Chest  - 1-2 x daily - 7 x weekly - 1 sets - 10 hold - Supine Posterior Pelvic Tilt  - 1-2 x daily - 7 x weekly - 1 sets - 10 reps - 3 hold Added - Cat Cow to Child's Pose  - 1 x daily - 7 x weekly - 1 sets - 5 reps - 20 hold   ASSESSMENT:   CLINICAL IMPRESSION: Shawn Stark arrives reporting overall improvement in pain and functional tolerance. He reports reduced pain with standing and lifting. He has completed initial 4 approved treatments. He  reports reduced pain intensity from severe to mild, rating pain 2/10 in lumbar and posterior thighs. His 5 x STS has improved. His Lumbar flexion AROM has improved causing only minimal pain in lumbar and hamstrings. His hamstring length has also improved bilaterally, progressing toward target. He has MET LTG# 1 & #5 and is making progress toward remaining LTGs. Progressed core strengthening today. He did have increased back pain after 20 seconds in forearm plank position. He will continue to benefit from progression of core strength, trunk and LE flexibility, as well as progressive lifting and body mechanics for completion of LTGs.      OBJECTIVE IMPAIRMENTS: decreased activity tolerance, decreased ROM, decreased strength, impaired flexibility, postural dysfunction, and pain.    ACTIVITY LIMITATIONS: lifting, bending, standing, and sleeping   PARTICIPATION LIMITATIONS: meal prep, cleaning, laundry, community activity, and occupation   PERSONAL FACTORS: Past/current experiences and Time since onset of injury/illness/exacerbation are also affecting patient's functional outcome.    REHAB POTENTIAL: Good   CLINICAL DECISION MAKING: Evolving/moderate complexity   EVALUATION COMPLEXITY: Moderate     GOALS:   SHORT TERM GOALS = LTGs   LONG TERM GOALS: Target date: 02/23/2022    Pt will be Ind in a final HEP to maintain achieved LOF  Baseline: initiated 02/15/22: compliant and independent with initial HEP Goal status: MET   2.  Pt will voice understanding of measures to assist in pain reduction  Baseline: HEP initiated 02/15/22: pt reports HEP is reducing his pain Goal status: ONGOING   3.  Increase trunk forward bending Full ROM with improved flexion mobility of the lumbar spine and increases hamstring ROM to 60d bilat Baseline: See flow sheets 02/15/22: Hamstrings Left 55 degrees: Right 58 degrees; Lumbar flexion min limited.  Goal status: ONGOING   4.  Pt will demonstrate improved core  strength per maintaining a plank from toes for 30 sec Baseline: decreased core strength with eval 02/15/22: able to hold 25 sec plank with LB pain increase after 20 seconds.  Goal status: ONGOING   5.  Improve 5xSTS by MCID of 5" as indication of improved functional mobility  Baseline: 02/06/22: 14.6 sec 02/15/22: 8.5 sec  Goal status: MET    6.  Pt's Mod Oswestry score will improve by the MCID of 11 points as indication of improved function  Baseline: 02/06/22: 5/50 or 10% disability 02/06/22: 5/50 or 10% disability Goal status: ONGOING   PLAN:   PT FREQUENCY: 2x/week   PT DURATION: 4 weeks   PLANNED INTERVENTIONS: Therapeutic exercises, Therapeutic activity, Patient/Family education, Self Care, Dry Needling, Electrical stimulation, Spinal manipulation, Spinal mobilization, Cryotherapy, Moist heat, Taping, Traction, Ultrasound, Ionotophoresis 4mg /ml Dexamethasone, Manual therapy, and Re-evaluation.  PLAN FOR NEXT SESSION:  assess response to HEP; progress therex as indicated; Lifting / body mechanics      Royden Purl PTA 02/15/22 10:14 AM Phone: [phone] Fax: [phone]

## 2022-02-19 NOTE — Therapy (Signed)
OUTPATIENT PHYSICAL THERAPY TREATMENT NOTE   Patient Name: Shawn Stark MRN: [id number] DOB:11-09-1985, 37 y.o., male Today's Date: 02/20/2022  PCP: Evelena Leyden, DO   REFERRING PROVIDER: Billey Co, MD  END OF SESSION:   PT End of Session - 02/20/22 0810     Visit Number 6    Number of Visits 9    Date for PT Re-Evaluation 03/30/22    Authorization Type [place] MEDICAID HEALTHY BLUE    Progress Note Due on Visit 10    PT Start Time 0804    PT Stop Time 0850    PT Time Calculation (min) 46 min    Activity Tolerance Patient tolerated treatment well    Behavior During Therapy [facility] for tasks assessed/performed             History reviewed. No pertinent past medical history. History reviewed. No pertinent surgical history. 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   Atypical chest pain 08/09/2020   Chronic back pain 08/09/2020   Tinea pedis of left foot 08/09/2020   History of tobacco use 08/09/2020   Constipation 08/09/2020   Encounter for health examination of refugee 08/05/2020    REFERRING DIAG: M54.9,G89.29 (ICD-10-CM) - Chronic bilateral back pain, unspecified back location   THERAPY DIAG:  Chronic bilateral low back pain with bilateral sciatica  Abnormal posture  Muscle weakness (generalized)  Other low back pain  Rationale for Evaluation and Treatment Rehabilitation  PERTINENT HISTORY: PERTINENT HISTORY:  HTN     PRECAUTIONS: None   SUBJECTIVE:                                                                                                                                                                                      SUBJECTIVE STATEMENT: pain contiunes to be  better, about a 3/10 today.  PAIN:  Are you having pain? 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 No other abnormalities.   PATIENT SURVEYS:  NT due to language barrier and time constant of eval, assess  next session  02/06/22 : Modified Oswestry: 5/50 or 10% disability    02/15/22: Modified Oswestry: 5/50 or 10% disability   SCREENING FOR RED FLAGS: Bowel or bladder incontinence: No Spinal tumors: No Cauda equina syndrome: No Compression fracture: No     COGNITION: Overall cognitive status: Within functional limits for tasks assessed                          SENSATION: WFL   MUSCLE LENGTH: Hamstrings: Right 45 deg; Left 45 deg: 02/15/22: Left 55 degrees: Right 58 degrees Thomas test: Right NT deg; Left NT deg   POSTURE: increased lumbar lordosis and decreased thoracic kyphosis   PALPATION: TTP paraspinally from lower cervical to low back   LUMBAR ROM:    AROM eval 02/15/22  Flexion Mod limited, pulling LBP tight hamstrings and lordotic curve does not reverse Min limited, reaches toes, improved lumbar flexion/lordosis, min pulling in LBP and hamstrings  Extension Full, pressure LBP Full, min pain  Right lateral flexion Full, R pulling/pressure LBP  Full, No pain  Left lateral flexion Full, R pulling LBP Full, No pain  Right rotation Full, sharp R LBP Full, no pain  Left rotation Full Full, no pain   (Blank rows = not tested)   LOWER EXTREMITY ROM:    WNLs and equal L and R Active  Right eval Left eval  Hip flexion      Hip extension       Hip abduction      Hip adduction      Hip internal rotation      Hip external rotation      Knee flexion      Knee extension      Ankle dorsiflexion      Ankle plantarflexion      Ankle inversion      Ankle eversion          LOWER EXTREMITY MMT:   Myotome screen WNLs and equal L and R MMT Right eval Left eval  Hip flexion      Hip extension      Hip abduction      Hip adduction      Hip internal rotation      Hip external rotation      Knee flexion      Knee extension      Ankle dorsiflexion      Ankle plantarflexion      Ankle inversion      Ankle eversion      Core Weak  bilat      LUMBAR SPECIAL TESTS:  Straight leg raise test: Negative, Slump test: Negative, SI Compression/distraction test: Negative, and FABER test: Negative   FUNCTIONAL TESTS:  5 times sit to stand: 02/06/22: 14.7 Sec : 02/15/22: 8.5 sec    GAIT: Distance walked: 240ft Assistive device utilized: None Level of assistance: Complete Independence Comments: WNLs   TODAY'S TREATMENT:  [facility] Adult PT Treatment:                                                DATE: 02/20/22 Therapeutic Exercise: Rec Bike L3 x 5 minutes  Seated hamstring stretch x2 30" each DKTC x2 30 sec  90/90 ab brace 5-10 sec holds x 10 Plank x5 20" Quadruped mule kicks x10 each Quadruped bird dogs  x5 each Therapeutic Activity: Hinged hip STS from mat table 2x10 25# Hinged hip lifting 25# from foot stool  Self care: Education for sleeping positions, pt to avoid sleeping on stomach     [facility] Adult PT Treatment:                                                DATE: 02/15/22 Therapeutic Exercise: Rec Bike L3 x 5 minutes  AROM lumbar flexion, extension, Side bending and rotation bilateral.  Plank, 25 sec, 20 sec - c/o back pain after 20-25 sec  90/90 ab brace 5-10 sec holds x 10 DKTC x 60 sec  Hamstring stretch with strap x 3 each  AROM lumbar flexion, extension, Side bending and rotation bilateral.    [facility] Adult PT  Treatment:                                                DATE: 02/13/22 Therapeutic Exercise: Rec bike L3 x 5 minutes  Cat/camel Bird dogs 5 sec x 5- cues for ab draw in [place] pose to qped x 5 SKTC X 3 each  DKTC x3 Sciatic nerve glide - 20x ea Hamstring stretch with strap  LTR Blue band clam supine [place] - 2x10, blue band  Abdominal isometric squeeze with swiss ball - 30x                                                                                                                 PATIENT EDUCATION:  Education details: Eval findings, POC, HEP Person educated: Patient Education method: Explanation, Demonstration, Tactile cues, Verbal cues, and Handouts Education comprehension: verbalized understanding, returned demonstration, verbal cues required, and tactile cues required   HOME EXERCISE PROGRAM: Access Code: [email or web address] URL: [email or web address] Date: 01/19/2022 Prepared by: Gar Ponto   Exercises - Supine Lower Trunk Rotation  - 1-2 x daily - 7 x weekly - 1 sets - 5 reps - 5 hold - Hooklying Hamstring Stretch with Strap  - 1-2 x daily - 7 x weekly - 1 sets - 3 reps - 20 hold - Supine Double Knee to Chest  - 1-2 x daily - 7 x weekly - 1 sets - 10 hold - Supine Posterior Pelvic Tilt  - 1-2 x daily - 7 x weekly - 1 sets - 10 reps - 3 hold Added - Cat Cow to Child's Pose  - 1 x daily - 7 x weekly - 1 sets - 5 reps - 20 hold   ASSESSMENT:   CLINICAL IMPRESSION: PT was continued for lumbopelvic flexibility and strengthening. Strengthening was progressed to lifting activities with instruction and completion on hinged hip lifting. With verbal cueing, pt was able to complete with better technique. Pt tolerated with min increase in  low back pain. Additionally, advised pt to avoid sleeping on hie stomach. Recommend sleeping in SL and supine. Overall, pt is making good progress with PT and would benefit from continued PT 1w3 for strengthening and lifting/body mechanics.    OBJECTIVE IMPAIRMENTS: decreased activity tolerance, decreased ROM, decreased strength, impaired flexibility, postural dysfunction, and pain.    ACTIVITY LIMITATIONS: lifting, bending, standing, and sleeping   PARTICIPATION LIMITATIONS: meal prep, cleaning, laundry, community activity, and occupation   PERSONAL FACTORS: Past/current experiences and Time since onset of injury/illness/exacerbation are also affecting patient's functional outcome.    REHAB POTENTIAL: Good   CLINICAL DECISION MAKING: Evolving/moderate complexity   EVALUATION COMPLEXITY: Moderate     GOALS:   SHORT TERM GOALS = LTGs   LONG TERM GOALS: Target date: 02/23/2022    Pt will be Ind in a final HEP to maintain achieved LOF  Baseline: initiated 02/15/22: compliant and independent with initial HEP Goal status: MET   2.  Pt will voice understanding of measures to assist in pain reduction  Baseline: HEP initiated 02/15/22: pt reports HEP is reducing his pain Goal status: ONGOING   3.  Increase trunk forward bending Full ROM with improved flexion mobility of the lumbar spine and increases hamstring ROM to 60d bilat Baseline: See flow sheets 02/15/22: Hamstrings Left 55 degrees: Right 58 degrees; Lumbar flexion min limited.  Goal status: ONGOING   4.  Pt will demonstrate improved core strength per maintaining a plank from toes for 30 sec Baseline: decreased core strength with eval 02/15/22: able to hold 25 sec plank with LB pain increase after 20 seconds.  Goal status: ONGOING   5.  Improve 5xSTS by MCID of 5" as indication of improved functional mobility  Baseline: 02/06/22: 14.6 sec 02/15/22: 8.5 sec  Goal status: MET    6.  Pt's Mod Oswestry score will improve by the MCID of 11 points as indication of improved function  Baseline: 02/06/22: 5/50 or 10% disability 02/06/22: 5/50 or 10% disability Goal status: ONGOING   PLAN:   PT FREQUENCY: 1x/week   PT DURATION: 4 weeks   PLANNED INTERVENTIONS:  Therapeutic exercises, Therapeutic activity, Patient/Family education, Self Care, Dry Needling, Electrical stimulation, Spinal manipulation, Spinal mobilization, Cryotherapy, Moist heat, Taping, Traction, Ultrasound, Ionotophoresis 4mg /ml Dexamethasone, Manual therapy, and Re-evaluation.   PLAN FOR NEXT SESSION:  assess response to HEP; progress therex as indicated; Lifting / body mechanics    [employer] MS, PT 02/20/22 9:20 AM

## 2022-02-20 ENCOUNTER — Ambulatory Visit: Payer: Medicaid Other

## 2022-02-20 DIAGNOSIS — G8929 Other chronic pain: Secondary | ICD-10-CM

## 2022-02-20 DIAGNOSIS — M5442 Lumbago with sciatica, left side: Secondary | ICD-10-CM | POA: Diagnosis not present

## 2022-02-20 DIAGNOSIS — M5441 Lumbago with sciatica, right side: Secondary | ICD-10-CM | POA: Diagnosis not present

## 2022-02-20 DIAGNOSIS — R293 Abnormal posture: Secondary | ICD-10-CM

## 2022-02-20 DIAGNOSIS — M6281 Muscle weakness (generalized): Secondary | ICD-10-CM

## 2022-02-20 DIAGNOSIS — M5459 Other low back pain: Secondary | ICD-10-CM | POA: Diagnosis not present

## 2022-02-22 ENCOUNTER — Ambulatory Visit: Payer: Medicaid Other | Admitting: Physical Therapy

## 2022-02-27 ENCOUNTER — Ambulatory Visit: Payer: Medicaid Other

## 2022-02-27 DIAGNOSIS — M5459 Other low back pain: Secondary | ICD-10-CM | POA: Diagnosis not present

## 2022-02-27 DIAGNOSIS — G8929 Other chronic pain: Secondary | ICD-10-CM

## 2022-02-27 DIAGNOSIS — M6281 Muscle weakness (generalized): Secondary | ICD-10-CM | POA: Diagnosis not present

## 2022-02-27 DIAGNOSIS — M5442 Lumbago with sciatica, left side: Secondary | ICD-10-CM | POA: Diagnosis not present

## 2022-02-27 DIAGNOSIS — M5441 Lumbago with sciatica, right side: Secondary | ICD-10-CM | POA: Diagnosis not present

## 2022-02-27 DIAGNOSIS — R293 Abnormal posture: Secondary | ICD-10-CM | POA: Diagnosis not present

## 2022-02-27 NOTE — Therapy (Signed)
OUTPATIENT PHYSICAL THERAPY TREATMENT NOTE   Patient Name: Shawn Stark MRN: [id number] DOB:August 31, 1985, 37 y.o., male 60 Date: 02/27/2022  PCP: Rise Patience, DO   REFERRING PROVIDER: Lenoria Chime, MD  END OF SESSION:   PT End of Session - 02/27/22 0810     Visit Number 7    Number of Visits 9    Date for PT Re-Evaluation 03/30/22    Authorization Type [place] MEDICAID HEALTHY BLUE 5 visits 02/19/22-04/18/22    Authorization - Visit Number 6    Authorization - Number of Visits 9    Progress Note Due on Visit 10    PT Start Time 0804    PT Stop Time 0848    PT Time Calculation (min) 44 min    Activity Tolerance Patient tolerated treatment well    Behavior During Therapy [facility] for tasks assessed/performed              History reviewed. No pertinent past medical history. History reviewed. No pertinent surgical history. 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   Atypical chest pain 08/09/2020   Chronic back pain 08/09/2020   Tinea pedis of left foot 08/09/2020   History of tobacco use 08/09/2020   Constipation 08/09/2020   Encounter for health examination of refugee 08/05/2020    REFERRING DIAG: M54.9,G89.29 (ICD-10-CM) - Chronic bilateral back pain, unspecified back location   THERAPY DIAG:  Chronic bilateral low back pain with bilateral sciatica  Abnormal posture  Muscle weakness (generalized)  Other low back pain  Rationale for Evaluation and Treatment Rehabilitation  PERTINENT HISTORY: PERTINENT HISTORY:  HTN     PRECAUTIONS: None   SUBJECTIVE:                                                                                                                                                                                       SUBJECTIVE STATEMENT: Pt feels he is improving and pain is now intermittent. Today, experiencing R LBP and bilat posterior thigh pain.  PAIN:  Are you having pain? 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 No other abnormalities.   PATIENT SURVEYS:  NT due to language barrier and time constant of eval, assess next session  02/06/22 : Modified Oswestry: 5/50 or 10% disability    02/15/22: Modified Oswestry: 5/50 or 10% disability   SCREENING FOR RED FLAGS: Bowel or bladder incontinence: No Spinal tumors: No Cauda equina syndrome: No Compression fracture: No     COGNITION: Overall cognitive status: Within functional limits for tasks assessed                          SENSATION: WFL   MUSCLE LENGTH: Hamstrings: Right 45 deg; Left 45 deg: 02/15/22: Left 55 degrees: Right 58 degrees Thomas test: Right NT deg; Left NT deg   POSTURE: increased lumbar lordosis and decreased thoracic kyphosis   PALPATION: TTP paraspinally from lower cervical to low back   LUMBAR ROM:    AROM eval 02/15/22  Flexion Mod limited, pulling LBP tight hamstrings and lordotic curve does not reverse Min limited, reaches toes, improved lumbar flexion/lordosis, min pulling in LBP and hamstrings  Extension Full, pressure LBP Full, min pain  Right lateral flexion Full, R pulling/pressure LBP  Full, No pain  Left lateral flexion Full, R pulling LBP Full, No pain  Right rotation Full, sharp R LBP Full, no pain  Left rotation Full  Full, no pain   (Blank rows = not tested)   LOWER EXTREMITY ROM:    WNLs and equal L and R Active  Right eval Left eval  Hip flexion      Hip extension      Hip abduction      Hip adduction      Hip internal rotation      Hip external rotation      Knee flexion      Knee extension      Ankle dorsiflexion      Ankle plantarflexion      Ankle inversion      Ankle eversion          LOWER EXTREMITY MMT:   Myotome screen WNLs and equal L and R MMT Right eval Left eval  Hip flexion      Hip extension      Hip abduction      Hip adduction      Hip internal rotation      Hip external rotation      Knee flexion      Knee extension      Ankle dorsiflexion      Ankle plantarflexion      Ankle inversion      Ankle eversion      Core Weak  bilat      LUMBAR SPECIAL TESTS:  Straight leg raise test: Negative, Slump test: Negative, SI Compression/distraction test: Negative, and FABER test: Negative   FUNCTIONAL TESTS:  5 times sit to stand: 02/06/22: 14.7 Sec : 02/15/22: 8.5 sec    GAIT: Distance walked: 29ft Assistive device utilized: None Level of assistance: Complete Independence Comments: WNLs   TODAY'S TREATMENT:  [facility] Adult PT Treatment:                                                DATE: 02/27/22 Therapeutic Exercise: Nustep L8 LE/UE Cat/Camel Child  pose forward and lateral to/from qped Qped mule kick x10 each Qped bird dogs x10 each Plank on toes x10 10", extended attempt 20" Leg press cybex 2x10 80# 2x10 Hip abd cybex 2x10 30# 2x10  [facility] Adult PT Treatment:                                                DATE: 02/20/22 Therapeutic Exercise: Rec Bike L3 x 5 minutes  Seated hamstring stretch x2 30" each DKTC x2 30 sec  90/90 ab brace 5-10 sec holds x 10 Plank x5 20" Quadruped mule kicks x10 each Quadruped bird dogs x5 each Therapeutic Activity: Hinged hip STS from mat table 2x10 25# Hinged hip lifting 25# from foot stool  Self care: Education for  sleeping positions, pt to avoid sleeping on stomach     [facility] Adult PT Treatment:                                                DATE: 02/15/22 Therapeutic Exercise: Rec Bike L3 x 5 minutes  AROM lumbar flexion, extension, Side bending and rotation bilateral.  Plank, 25 sec, 20 sec - c/o back pain after 20-25 sec  90/90 ab brace 5-10 sec holds x 10 DKTC x 60 sec  Hamstring stretch with strap x 3 each  AROM lumbar flexion, extension, Side bending and rotation bilateral.                                                                                                                  PATIENT EDUCATION:  Education details: Eval findings, POC, HEP Person educated: Patient Education method: Explanation, Demonstration, Tactile cues, Verbal cues, and Handouts Education comprehension: verbalized understanding, returned demonstration, verbal cues required, and tactile cues required   HOME EXERCISE PROGRAM: Access Code: [email or web address] URL: [email or web address] Date: 01/19/2022 Prepared by: Gar Ponto   Exercises - Supine Lower Trunk Rotation  - 1-2 x daily - 7 x weekly - 1 sets - 5 reps - 5 hold - Hooklying Hamstring Stretch with Strap  - 1-2 x daily - 7 x weekly - 1 sets - 3 reps - 20 hold - Supine Double Knee to Chest  - 1-2 x daily - 7 x weekly - 1 sets - 10 hold - Supine Posterior Pelvic Tilt  - 1-2 x daily - 7 x weekly - 1 sets - 10 reps - 3 hold Added - Cat Cow to Child's Pose  - 1 x daily - 7 x weekly - 1 sets - 5 reps - 20 hold   ASSESSMENT:   CLINICAL IMPRESSION: PT was completed back, abdominal and lower body strengthening with increased physical demand. Pt tolertaed the prescribed therex with adverse effects. Following the completion of the session, pt reported  a decrease in pain to 1-2/10 level. Pt will continue to benefit from skilled PT to address strengthening and lifting/body mechanics for improved function   OBJECTIVE IMPAIRMENTS: decreased activity tolerance,  decreased ROM, decreased strength, impaired flexibility, postural dysfunction, and pain.    ACTIVITY LIMITATIONS: lifting, bending, standing, and sleeping   PARTICIPATION LIMITATIONS: meal prep, cleaning, laundry, community activity, and occupation   PERSONAL FACTORS: Past/current experiences and Time since onset of injury/illness/exacerbation are also affecting patient's functional outcome.    REHAB POTENTIAL: Good   CLINICAL DECISION MAKING: Evolving/moderate complexity   EVALUATION COMPLEXITY: Moderate     GOALS:   SHORT TERM GOALS = LTGs   LONG TERM GOALS: Target date: 02/23/2022    Pt will be Ind in a final HEP to maintain achieved LOF  Baseline: initiated 02/15/22: compliant and independent with initial HEP Goal status: MET   2.  Pt will voice understanding of measures to assist in pain reduction  Baseline: HEP initiated 02/15/22: pt reports HEP is reducing his pain Goal status: ONGOING   3.  Increase trunk forward bending Full ROM with improved flexion mobility of the lumbar spine and increases hamstring ROM to 60d bilat Baseline: See flow sheets 02/15/22: Hamstrings Left 55 degrees: Right 58 degrees; Lumbar flexion min limited.  Goal status: ONGOING   4.  Pt will demonstrate improved core strength per maintaining a plank from toes for 30 sec Baseline: decreased core strength with eval 02/15/22: able to hold 25 sec plank with LB pain increase after 20 seconds.  Goal status: ONGOING   5.  Improve 5xSTS by MCID of 5" as indication of improved functional mobility  Baseline: 02/06/22: 14.6 sec 02/15/22: 8.5 sec  Goal status: MET    6.  Pt's Mod Oswestry score will improve by the MCID of 11 points as indication of improved function  Baseline: 02/06/22: 5/50 or 10% disability 02/06/22: 5/50 or 10% disability Goal status: ONGOING   PLAN:   PT FREQUENCY: 1x/week   PT DURATION: 4 weeks   PLANNED INTERVENTIONS: Therapeutic exercises, Therapeutic activity, Patient/Family  education, Self Care, Dry Needling, Electrical stimulation, Spinal manipulation, Spinal mobilization, Cryotherapy, Moist heat, Taping, Traction, Ultrasound, Ionotophoresis 4mg /ml Dexamethasone, Manual therapy, and Re-evaluation.   PLAN FOR NEXT SESSION:  assess response to HEP; progress therex as indicated; Lifting / body mechanics    [employer] MS, PT 02/27/22 9:36 AM

## 2022-03-01 NOTE — Therapy (Signed)
OUTPATIENT PHYSICAL THERAPY TREATMENT NOTE   Patient Name: Shawn Stark MRN: [id number] DOB:10-02-1985, 37 y.o., male Today's Date: 03/02/2022  PCP: Evelena Leyden, DO   REFERRING PROVIDER: Billey Co, MD  END OF SESSION:   PT End of Session - 03/02/22 [phone]     Visit Number 7    Number of Visits 9    Date for PT Re-Evaluation 03/30/22    Authorization Type [place] MEDICAID HEALTHY BLUE 5 visits 02/19/22-04/18/22    Authorization - Visit Number 7    Authorization - Number of Visits 9    Progress Note Due on Visit 10    PT Start Time 0806    PT Stop Time 0849    PT Time Calculation (min) 43 min    Activity Tolerance Patient tolerated treatment well    Behavior During Therapy [facility] for tasks assessed/performed               History reviewed. No pertinent past medical history. History reviewed. No pertinent surgical history. 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   Atypical chest pain 08/09/2020   Chronic back pain 08/09/2020   Tinea pedis of left foot 08/09/2020   History of tobacco use 08/09/2020   Constipation 08/09/2020   Encounter for health examination of refugee 08/05/2020    REFERRING DIAG: M54.9,G89.29 (ICD-10-CM) - Chronic bilateral back pain, unspecified back location   THERAPY DIAG:  Chronic bilateral low back pain with bilateral sciatica  Abnormal posture  Muscle weakness (generalized)  Rationale for Evaluation and Treatment Rehabilitation  PERTINENT HISTORY: PERTINENT HISTORY:  HTN     PRECAUTIONS: None   SUBJECTIVE:                                                                                                                                                                                       SUBJECTIVE STATEMENT: Pt continues to report a low level for LBP, but overall improvement  PAIN:  Are you having pain? 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 No other abnormalities.   PATIENT SURVEYS:  NT due to language barrier and time constant of eval, assess next session  02/06/22 : Modified Oswestry: 5/50 or 10% disability    02/15/22: Modified Oswestry: 5/50 or 10% disability   SCREENING FOR RED FLAGS: Bowel or bladder incontinence: No Spinal tumors: No Cauda equina syndrome: No Compression fracture: No     COGNITION: Overall cognitive status: Within functional limits for tasks assessed                          SENSATION: WFL   MUSCLE LENGTH: Hamstrings: Right 45 deg; Left 45 deg: 02/15/22: Left 55 degrees: Right 58 degrees; 03/02/22= 60d, R 63d Thomas test: Right NT deg; Left NT deg   POSTURE: increased lumbar lordosis and decreased thoracic kyphosis   PALPATION: TTP paraspinally from lower cervical to low back   LUMBAR ROM:    AROM eval 02/15/22  Flexion Mod limited, pulling LBP tight hamstrings and lordotic curve does not reverse Min limited, reaches toes, improved lumbar flexion/lordosis, min pulling in LBP and hamstrings  Extension Full, pressure LBP Full, min pain  Right lateral flexion Full, R pulling/pressure LBP  Full, No pain  Left lateral flexion Full, R pulling LBP Full, No pain  Right rotation Full, sharp R LBP Full, no pain  Left rotation Full Full, no pain   (Blank rows = not tested)    LOWER EXTREMITY ROM:    WNLs and equal L and R Active  Right eval Left eval  Hip flexion      Hip extension      Hip abduction      Hip adduction      Hip internal rotation      Hip external rotation      Knee flexion      Knee extension      Ankle dorsiflexion      Ankle plantarflexion      Ankle inversion      Ankle eversion          LOWER EXTREMITY MMT:   Myotome screen WNLs and equal L and R MMT Right eval Left eval  Hip flexion      Hip extension      Hip abduction      Hip adduction      Hip internal rotation      Hip external rotation      Knee flexion      Knee extension      Ankle dorsiflexion      Ankle plantarflexion      Ankle inversion      Ankle eversion      Core Weak  bilat      LUMBAR SPECIAL TESTS:  Straight leg raise test: Negative, Slump test: Negative, SI Compression/distraction test: Negative, and FABER test: Negative   FUNCTIONAL TESTS:  5 times sit to stand: 02/06/22: 14.7 Sec : 02/15/22: 8.5 sec    GAIT: Distance walked: 26ft Assistive device utilized: None Level of assistance: Complete Independence Comments: WNLs   TODAY'S TREATMENT:  [facility] Adult PT Treatment:                                                DATE: 03/02/22 Therapeutic Exercise: Nustep 53min L8 LE/UE Seated hamstring stretch x2 30" each Cat/Camel Child pose  forward and lateral to/from qped Prone alt arms/legs 2x10 Superman 2x10 Plank on toes x10 10", extended attempt 25" Abdominal bracing x5 10" Framer's carry 30# 200' each Hinged hip lifting 30# from floor 2x10 Dead lift 30# x10  [facility] Adult PT Treatment:                                                DATE: 02/27/22 Therapeutic Exercise: Nustep 52min L8 LE/UE Cat/Camel Child pose forward and lateral to/from qped Qped mule kick x10 each Qped bird dogs x10 each Plank on toes x10 10", extended attempt 20" Leg press cybex 2x10 80# 2x10 Hip abd cybex 2x10 30# 2x10  [facility] Adult PT Treatment:                                                 DATE: 02/20/22 Therapeutic Exercise: Rec Bike L3 x 5 minutes  Seated hamstring stretch x2 30" each DKTC x2 30 sec  90/90 ab brace 5-10 sec holds x 10 Plank x5 20" Quadruped mule kicks x10 each Quadruped bird dogs x5 each Therapeutic Activity: Hinged hip STS from mat table 2x10 25# Hinged hip lifting 25# from foot stool  Self care: Education for sleeping positions, pt to avoid sleeping on stomach                                                                                                                 PATIENT EDUCATION:  Education details: Eval findings, POC, HEP Person educated: Patient Education method: Explanation, Demonstration, Tactile cues, Verbal cues, and Handouts Education comprehension: verbalized understanding, returned demonstration, verbal cues required, and tactile cues required   HOME EXERCISE PROGRAM: Access Code: [email or web address] URL: [email or web address] Date: 01/19/2022 Prepared by: Gar Ponto   Exercises - Supine Lower Trunk Rotation  - 1-2 x daily - 7 x weekly - 1 sets - 5 reps - 5 hold - Hooklying Hamstring Stretch with Strap  - 1-2 x daily - 7 x weekly - 1 sets - 3 reps - 20 hold - Supine Double Knee to Chest  - 1-2 x daily - 7 x weekly - 1 sets - 10 hold - Supine Posterior Pelvic Tilt  - 1-2 x daily - 7 x weekly - 1 sets - 10 reps - 3 hold Added - Cat Cow to Child's Pose  - 1 x daily - 7 x weekly - 1 sets - 5 reps - 20 hold   ASSESSMENT:   CLINICAL IMPRESSION: PT was completed for progressive core and LE stregthening. Pt completed hinged hip lifting with proper technique. Pt is tolerated increased demand for core strengthening. Pt's level of LBP was not increased with today's session. Will continue PT efforts to improve core strength. Will consider TPDN re: addressing  residual LBP. Overall progress has been good.  OBJECTIVE IMPAIRMENTS: decreased activity tolerance, decreased ROM, decreased strength, impaired  flexibility, postural dysfunction, and pain.    ACTIVITY LIMITATIONS: lifting, bending, standing, and sleeping   PARTICIPATION LIMITATIONS: meal prep, cleaning, laundry, community activity, and occupation   PERSONAL FACTORS: Past/current experiences and Time since onset of injury/illness/exacerbation are also affecting patient's functional outcome.    REHAB POTENTIAL: Good   CLINICAL DECISION MAKING: Evolving/moderate complexity   EVALUATION COMPLEXITY: Moderate     GOALS:   SHORT TERM GOALS = LTGs   LONG TERM GOALS: Target date: 02/23/2022    Pt will be Ind in a final HEP to maintain achieved LOF  Baseline: initiated 02/15/22: compliant and independent with initial HEP Goal status: MET   2.  Pt will voice understanding of measures to assist in pain reduction  Baseline: HEP initiated 02/15/22: pt reports HEP is reducing his pain Goal status: ONGOING   3.  Increase trunk forward bending Full ROM with improved flexion mobility of the lumbar spine and increases hamstring ROM to 60d bilat Baseline: See flow sheets 02/15/22: Hamstrings Left 55 degrees: Right 58 degrees; Lumbar flexion min limited. 03/02/22= 60d, R 63d Goal status: MET   4.  Pt will demonstrate improved core strength per maintaining a plank from toes for 30 sec Baseline: decreased core strength with eval 02/15/22: able to hold 25 sec plank with LB pain increase after 20 seconds. 03/02/22: 25" limited by weakness  Goal status: ONGOING   5.  Improve 5xSTS by MCID of 5" as indication of improved functional mobility  Baseline: 02/06/22: 14.6 sec 02/15/22: 8.5 sec  Goal status: MET    6.  Pt's Mod Oswestry score will improve by the MCID of 11 points as indication of improved function  Baseline: 02/06/22: 5/50 or 10% disability 02/06/22: 5/50 or 10% disability Goal status: ONGOING   PLAN:   PT FREQUENCY: 1x/week   PT DURATION: 4 weeks   PLANNED INTERVENTIONS: Therapeutic exercises, Therapeutic activity, Patient/Family  education, Self Care, Dry Needling, Electrical stimulation, Spinal manipulation, Spinal mobilization, Cryotherapy, Moist heat, Taping, Traction, Ultrasound, Ionotophoresis 4mg /ml Dexamethasone, Manual therapy, and Re-evaluation.   PLAN FOR NEXT SESSION:  assess response to HEP; progress therex as indicated; Lifting / body mechanics    [employer] MS, PT 03/02/22 1:48 PM

## 2022-03-02 ENCOUNTER — Ambulatory Visit: Payer: Medicaid Other | Attending: Family Medicine

## 2022-03-02 DIAGNOSIS — M6281 Muscle weakness (generalized): Secondary | ICD-10-CM

## 2022-03-02 DIAGNOSIS — M5441 Lumbago with sciatica, right side: Secondary | ICD-10-CM | POA: Diagnosis not present

## 2022-03-02 DIAGNOSIS — M5442 Lumbago with sciatica, left side: Secondary | ICD-10-CM | POA: Diagnosis not present

## 2022-03-02 DIAGNOSIS — R293 Abnormal posture: Secondary | ICD-10-CM | POA: Diagnosis not present

## 2022-03-02 DIAGNOSIS — G8929 Other chronic pain: Secondary | ICD-10-CM | POA: Diagnosis not present

## 2022-03-06 ENCOUNTER — Encounter: Payer: Self-pay | Admitting: Physical Therapy

## 2022-03-06 ENCOUNTER — Ambulatory Visit: Payer: Medicaid Other | Admitting: Physical Therapy

## 2022-03-06 DIAGNOSIS — R293 Abnormal posture: Secondary | ICD-10-CM

## 2022-03-06 DIAGNOSIS — M6281 Muscle weakness (generalized): Secondary | ICD-10-CM | POA: Diagnosis not present

## 2022-03-06 DIAGNOSIS — G8929 Other chronic pain: Secondary | ICD-10-CM

## 2022-03-06 DIAGNOSIS — M5442 Lumbago with sciatica, left side: Secondary | ICD-10-CM | POA: Diagnosis not present

## 2022-03-06 DIAGNOSIS — M5441 Lumbago with sciatica, right side: Secondary | ICD-10-CM | POA: Diagnosis not present

## 2022-03-06 NOTE — Therapy (Incomplete Revision)
OUTPATIENT PHYSICAL THERAPY TREATMENT NOTE/Discharge   Patient Name: Shawn Stark MRN: [name] DOB:11-03-1985, 37 y.o., male 33 Date: 03/06/2022  PCP: Shawn Patience, DO   REFERRING PROVIDER: Lenoria Chime, MD  END OF SESSION:   PT End of Session - 03/06/22 0804     Visit Number 8    Number of Visits 9    Date for PT Re-Evaluation 03/30/22    Authorization Type [place] MEDICAID HEALTHY BLUE 5 visits 02/19/22-04/18/22    Authorization - Visit Number 8    Authorization - Number of Visits 9    PT Start Time 0802    PT Stop Time 0840    PT Time Calculation (min) 38 min               History reviewed. No pertinent past medical history. History reviewed. No pertinent surgical history. 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   Atypical chest pain 08/09/2020   Chronic back pain 08/09/2020   Tinea pedis of left foot 08/09/2020   History of tobacco use 08/09/2020   Constipation 08/09/2020   Encounter for health examination of refugee 08/05/2020    REFERRING DIAG: M54.9,G89.29 (ICD-10-CM) - Chronic bilateral back pain, unspecified back location   THERAPY DIAG:  Chronic bilateral low back pain with bilateral sciatica  Abnormal posture  Rationale for Evaluation and Treatment Rehabilitation  PERTINENT HISTORY: PERTINENT HISTORY:  HTN     PRECAUTIONS: None   SUBJECTIVE:                                                                                                                                                                                      SUBJECTIVE STATEMENT: My pain is significantly better.   PAIN:  Are you having pain? 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 No other abnormalities.   PATIENT SURVEYS:  NT due to language barrier and time constant of eval, assess next session  02/06/22 : Modified Oswestry: 5/50 or 10% disability    02/15/22:  Modified Oswestry: 5/50 or 10% disability   SCREENING FOR RED FLAGS: Bowel or bladder incontinence: No Spinal tumors: No Cauda equina syndrome: No Compression fracture: No     COGNITION: Overall cognitive status: Within functional limits for tasks assessed                          SENSATION: WFL   MUSCLE LENGTH: Hamstrings: Right 45 deg; Left 45 deg: 02/15/22: Left 55 degrees: Right 58 degrees; 03/02/22= 60d, R 63d Thomas test: Right NT deg; Left NT deg   POSTURE: increased lumbar lordosis and decreased thoracic kyphosis   PALPATION: TTP paraspinally from lower cervical to low back   LUMBAR ROM:    AROM eval 02/15/22 03/06/22  Flexion Mod limited, pulling LBP tight hamstrings and lordotic curve does not reverse Min limited, reaches toes, improved lumbar flexion/lordosis, min pulling in LBP and hamstrings Min limited, reaches toes, improved lumbar flexion/lordosis, min pulling in LBP and hamstrings  Extension Full, pressure LBP Full, min pain   Right lateral flexion Full, R pulling/pressure LBP  Full, No pain   Left lateral flexion Full, R pulling LBP Full, No pain   Right rotation Full, sharp R LBP Full, no pain   Left rotation Full Full, no pain    (Blank rows = not tested)   LOWER EXTREMITY ROM:    WNLs and equal L and R Active  Right eval Left eval   Hip flexion      Hip extension      Hip abduction      Hip adduction      Hip internal rotation      Hip external rotation      Knee flexion      Knee extension      Ankle dorsiflexion      Ankle plantarflexion      Ankle inversion      Ankle eversion          LOWER EXTREMITY MMT:   Myotome screen WNLs and equal L and R MMT Right eval Left eval  Hip flexion      Hip extension      Hip abduction      Hip adduction      Hip internal rotation      Hip external rotation      Knee flexion      Knee extension      Ankle dorsiflexion      Ankle plantarflexion      Ankle inversion      Ankle eversion      Core Weak  bilat      LUMBAR SPECIAL TESTS:  Straight leg raise test: Negative, Slump test: Negative, SI Compression/distraction test: Negative, and FABER test: Negative   FUNCTIONAL TESTS:  5 times sit to stand: 02/06/22: 14.7 Sec : 02/15/22: 8.5 sec    GAIT: Distance walked: 270f Assistive device utilized: None Level of assistance: Complete Independence Comments: WNLs   [facility]  [facility]                                                [facility] Therapeutic Exercise: Review of HEP  [facility] Adult PT Treatment:  DATE: 03/02/22 Therapeutic Exercise: Nustep 81mn L8 LE/UE Seated hamstring stretch x2 30" each Cat/Camel Child pose forward and lateral to/from qped Prone alt arms/legs 2x10 Superman 2x10 Plank on toes x10 10", extended attempt 25" Abdominal bracing x5 10" Framer's carry 30# 200' each Hinged hip lifting 30# from floor 2x10 Dead lift 30# x10  [facility] Adult PT Treatment:                                                DATE: 02/27/22 Therapeutic Exercise: Nustep 574m L8 LE/UE Cat/Camel Child pose forward and lateral to/from qped Qped mule kick x10 each Qped bird dogs x10 each Plank on toes x10 10", extended attempt 20" Leg press cybex 2x10 80# 2x10 Hip abd cybex 2x10 30#  2x10  [facility] Adult PT Treatment:                                                DATE: 02/20/22 Therapeutic Exercise: Rec Bike L3 x 5 minutes  Seated hamstring stretch x2 30" each DKTC x2 30 sec  90/90 ab brace 5-10 sec holds x 10 Plank x5 20" Quadruped mule kicks x10 each Quadruped bird dogs x5 each Therapeutic Activity: Hinged hip STS from mat table 2x10 25# Hinged hip lifting 25# from foot stool  Self care: Education for sleeping positions, pt to avoid sleeping on stomach                                                                                                                 PATIENT EDUCATION:  Education details: Eval findings, POC, HEP Person educated: Patient Education method: Explanation, Demonstration, Tactile cues, Verbal cues, and Handouts Education comprehension: verbalized understanding, returned demonstration, verbal cues required, and tactile cues required   HOME EXERCISE PROGRAM: Access Code: [email or web address] URL: [email or web address] Date: 01/19/2022 Prepared by: AlGar Ponto Exercises - Supine Lower Trunk Rotation  - 1-2 x daily - 7 x weekly - 1 sets - 5 reps - 5 hold - Hooklying Hamstring Stretch with Strap  - 1-2 x daily - 7 x weekly - 1 sets - 3 reps - 20 hold - Supine Double Knee to Chest  - 1-2 x daily - 7 x weekly - 1 sets - 10 hold - Supine Posterior Pelvic Tilt  - 1-2 x daily - 7 x weekly - 1 sets - 10 reps - 3 hold Added - Cat Cow to Child's Pose  - 1 x daily - 7 x weekly - 1 sets - 5 reps - 20 hold   ASSESSMENT:   CLINICAL IMPRESSION: Pt arrives reporting significant decrease in overall back pain and hamstring pain.  His pain is now 3/10 at most and intermittent. He is able to hold 35 sec plank demonstrating  improved core strength and meeting LTG# 4. He is pleased with his Current LOF and agreeable to DC to HEP today. He was educated on how to request future PT from MD if pain returns. Today he has a severs headache and reports body aches  that started yesterday. He was given a face mask and he plans to go to urgent care when he leaves clinic. Reviewed HEP and pt demonstrates independence. He is agreeable to DC to HEP.    OBJECTIVE IMPAIRMENTS: decreased activity tolerance, decreased ROM, decreased strength, impaired flexibility, postural dysfunction, and pain.    ACTIVITY LIMITATIONS: lifting, bending, standing, and sleeping   PARTICIPATION LIMITATIONS: meal prep, cleaning, laundry, community activity, and occupation   PERSONAL FACTORS: Past/current experiences and Time since onset of injury/illness/exacerbation are also affecting patient's functional outcome.    REHAB POTENTIAL: Good   CLINICAL DECISION MAKING: Evolving/moderate complexity   EVALUATION COMPLEXITY: Moderate     GOALS:   SHORT TERM GOALS = LTGs   LONG TERM GOALS: Target date: 02/23/2022    Pt will be Ind in a final HEP to maintain achieved LOF  Baseline: initiated 02/15/22: compliant and independent with initial HEP Goal status: MET   2.  Pt will voice understanding of measures to assist in pain reduction  Baseline: HEP initiated 02/15/22: pt reports HEP is reducing his pain 03/06/22: Pt uses HEP for pain management Goal status: MET   3.  Increase trunk forward bending Full ROM with improved flexion mobility of the lumbar spine and increases hamstring ROM to 60d bilat Baseline: See flow sheets 02/15/22: Hamstrings Left 55 degrees: Right 58 degrees; Lumbar flexion min limited. 03/02/22= 60d, R 63d Goal status: MET   4.  Pt will demonstrate improved core strength per maintaining a plank from toes for 30 sec Baseline: decreased core strength with eval 02/15/22: able to hold 25 sec plank with LB pain increase after 20 seconds. 03/02/22: 25" limited by weakness  03/06/22: 35 sec  Goal status: MET   5.  Improve 5xSTS by MCID of 5" as indication of improved functional mobility  Baseline: 02/06/22: 14.6 sec 02/15/22: 8.5 sec  Goal status: MET    6.  Pt's  Mod Oswestry score will improve by the MCID of 11 points as indication of improved function  Baseline: 02/06/22: 5/50 or 10% disability 02/15/22: 5/50 or 10% disability Goal status: DEFERRED   PLAN:   PT FREQUENCY: 1x/week   PT DURATION: 4 weeks   PLANNED INTERVENTIONS: Therapeutic exercises, Therapeutic activity, Patient/Family education, Self Care, Dry Needling, Electrical stimulation, Spinal manipulation, Spinal mobilization, Cryotherapy, Moist heat, Taping, Traction, Ultrasound, Ionotophoresis 64m/ml Dexamethasone, Manual therapy, and Re-evaluation.   PLAN FOR NEXT SESSION: N/A discharge to HEP today  JHessie Diener PTA 03/06/22 8:42 AM Phone: [phone]: [phone]  PHYSICAL THERAPY DISCHARGE SUMMARY  Visits from Start of Care: 8  Current functional level related to goals / functional outcomes: See clinical impression and PT goals    Remaining deficits: See clinical impression and PT goals    Education / Equipment: HEP   Patient agrees to discharge. Patient goals were met. Patient is being discharged due to being pleased with the current functional level.   Daysha Ashmore MS, PT 03/13/22 8:42 AM

## 2022-03-06 NOTE — Therapy (Addendum)
OUTPATIENT PHYSICAL THERAPY TREATMENT NOTE/Discharge   Patient Name: Shawn Stark MRN: [id number] DOB:1985/02/03, 37 y.o., male 50 Date: 03/06/2022  PCP: Rise Patience, DO   REFERRING PROVIDER: Lenoria Chime, MD  END OF SESSION:   PT End of Session - 03/06/22 0804     Visit Number 8    Number of Visits 9    Date for PT Re-Evaluation 03/30/22    Authorization Type [place] [employer] 5 visits 02/19/22-04/18/22    Authorization - Visit Number 8    Authorization - Number of Visits 9    PT Start Time 0802    PT Stop Time 0840    PT Time Calculation (min) 38 min               History reviewed. No pertinent past medical history. History reviewed. No pertinent surgical history. 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   Atypical chest pain 08/09/2020   Chronic back pain 08/09/2020   Tinea pedis of left foot 08/09/2020   History of tobacco use 08/09/2020   Constipation 08/09/2020   Encounter for health examination of refugee 08/05/2020    REFERRING DIAG: M54.9,G89.29 (ICD-10-CM) - Chronic bilateral back pain, unspecified back location   THERAPY DIAG:  Chronic bilateral low back pain with bilateral sciatica  Abnormal posture  Rationale for Evaluation and Treatment Rehabilitation  PERTINENT HISTORY: PERTINENT HISTORY:  HTN     PRECAUTIONS: None   SUBJECTIVE:                                                                                                                                                                                      SUBJECTIVE STATEMENT: My pain is significantly better.   PAIN:  Are you having pain? 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 No other abnormalities.   PATIENT SURVEYS:  NT due to language barrier and time constant of eval, assess next session  02/06/22 : Modified Oswestry: 5/50 or 10% disability    02/15/22:  Modified Oswestry: 5/50 or 10% disability   SCREENING FOR RED FLAGS: Bowel or bladder incontinence: No Spinal tumors: No Cauda equina syndrome: No Compression fracture: No     COGNITION: Overall cognitive status: Within functional limits for tasks assessed                          SENSATION: WFL   MUSCLE LENGTH: Hamstrings: Right 45 deg; Left 45 deg: 02/15/22: Left 55 degrees: Right 58 degrees; 03/02/22= 60d, R 63d Thomas test: Right NT deg; Left NT deg   POSTURE: increased lumbar lordosis and decreased thoracic kyphosis   PALPATION: TTP paraspinally from lower cervical to low back   LUMBAR ROM:    AROM eval 02/15/22 03/06/22  Flexion Mod limited, pulling LBP tight hamstrings and lordotic curve does not reverse Min limited, reaches toes, improved lumbar flexion/lordosis, min pulling in LBP and hamstrings Min limited, reaches toes, improved lumbar flexion/lordosis, min pulling in LBP and hamstrings  Extension Full, pressure LBP Full, min pain   Right lateral flexion Full, R pulling/pressure LBP  Full, No pain   Left lateral flexion Full, R pulling LBP Full, No pain   Right rotation Full, sharp R LBP Full, no pain   Left rotation Full Full, no pain    (Blank rows = not tested)   LOWER EXTREMITY ROM:    WNLs and equal L and R Active  Right eval Left eval   Hip flexion      Hip extension      Hip abduction      Hip adduction      Hip internal rotation      Hip external rotation      Knee flexion      Knee extension      Ankle dorsiflexion      Ankle plantarflexion      Ankle inversion      Ankle eversion          LOWER EXTREMITY MMT:   Myotome screen WNLs and equal L and R MMT Right eval Left eval  Hip flexion      Hip extension      Hip abduction      Hip adduction      Hip internal rotation      Hip external rotation      Knee flexion      Knee extension      Ankle dorsiflexion      Ankle plantarflexion      Ankle inversion      Ankle eversion      Core Weak  bilat      LUMBAR SPECIAL TESTS:  Straight leg raise test: Negative, Slump test: Negative, SI Compression/distraction test: Negative, and FABER test: Negative   FUNCTIONAL TESTS:  5 times sit to stand: 02/06/22: 14.7 Sec : 02/15/22: 8.5 sec    GAIT: Distance walked: 286f Assistive device utilized: None Level of assistance: Complete Independence Comments: WNLs   [facility]                                                [facility] Therapeutic Exercise: Review of HEP  [facility] Adult PT Treatment:  DATE: 03/02/22 Therapeutic Exercise: Nustep 17mn L8 LE/UE Seated hamstring stretch x2 30" each Cat/Camel Child pose forward and lateral to/from qped Prone alt arms/legs 2x10 Superman 2x10 Plank on toes x10 10", extended attempt 25" Abdominal bracing x5 10" Framer's carry 30# 200' each Hinged hip lifting 30# from floor 2x10 Dead lift 30# x10  [facility] Adult PT Treatment:                                                DATE: 02/27/22 Therapeutic Exercise: Nustep 575m L8 LE/UE Cat/Camel Child pose forward and lateral to/from qped Qped mule kick x10 each Qped bird dogs x10 each Plank on toes x10 10", extended attempt 20" Leg press cybex 2x10 80# 2x10 Hip abd cybex 2x10 30#  2x10  [facility] Adult PT Treatment:                                                DATE: 02/20/22 Therapeutic Exercise: Rec Bike L3 x 5 minutes  Seated hamstring stretch x2 30" each DKTC x2 30 sec  90/90 ab brace 5-10 sec holds x 10 Plank x5 20" Quadruped mule kicks x10 each Quadruped bird dogs x5 each Therapeutic Activity: Hinged hip STS from mat table 2x10 25# Hinged hip lifting 25# from foot stool  Self care: Education for sleeping positions, pt to avoid sleeping on stomach                                                                                                                 PATIENT EDUCATION:  Education details: Eval findings, POC, HEP Person educated: Patient Education method: Explanation, Demonstration, Tactile cues, Verbal cues, and Handouts Education comprehension: verbalized understanding, returned demonstration, verbal cues required, and tactile cues required   HOME EXERCISE PROGRAM: Access Code: [email or web address] URL: [email or web address] Date: 01/19/2022 Prepared by: AlGar Ponto Exercises - Supine Lower Trunk Rotation  - 1-2 x daily - 7 x weekly - 1 sets - 5 reps - 5 hold - Hooklying Hamstring Stretch with Strap  - 1-2 x daily - 7 x weekly - 1 sets - 3 reps - 20 hold - Supine Double Knee to Chest  - 1-2 x daily - 7 x weekly - 1 sets - 10 hold - Supine Posterior Pelvic Tilt  - 1-2 x daily - 7 x weekly - 1 sets - 10 reps - 3 hold Added - Cat Cow to Child's Pose  - 1 x daily - 7 x weekly - 1 sets - 5 reps - 20 hold   ASSESSMENT:   CLINICAL IMPRESSION: Pt arrives reporting significant decrease in overall back pain and hamstring pain.  His pain is now 3/10 at most and intermittent. He is able to hold 35 sec plank demonstrating  improved core strength and meeting LTG# 4. He is pleased with his Current LOF and agreeable to DC to HEP today. He was educated on how to request future PT from MD if pain returns. Today he has a severs headache and reports body aches  that started yesterday. He was given a face mask and he plans to go to urgent care when he leaves clinic. Reviewed HEP and pt demonstrates independence. He is agreeable to DC to HEP.    OBJECTIVE IMPAIRMENTS: decreased activity tolerance, decreased ROM, decreased strength, impaired flexibility, postural dysfunction, and pain.    ACTIVITY LIMITATIONS: lifting, bending, standing, and sleeping   PARTICIPATION LIMITATIONS: meal prep, cleaning, laundry, community activity, and occupation   PERSONAL FACTORS: Past/current experiences and Time since onset of injury/illness/exacerbation are also affecting patient's functional outcome.    REHAB POTENTIAL: Good   CLINICAL DECISION MAKING: Evolving/moderate complexity   EVALUATION COMPLEXITY: Moderate     GOALS:   SHORT TERM GOALS = LTGs   LONG TERM GOALS: Target date: 02/23/2022    Pt will be Ind in a final HEP to maintain achieved LOF  Baseline: initiated 02/15/22: compliant and independent with initial HEP Goal status: MET   2.  Pt will voice understanding of measures to assist in pain reduction  Baseline: HEP initiated 02/15/22: pt reports HEP is reducing his pain 03/06/22: Pt uses HEP for pain management Goal status: MET   3.  Increase trunk forward bending Full ROM with improved flexion mobility of the lumbar spine and increases hamstring ROM to 60d bilat Baseline: See flow sheets 02/15/22: Hamstrings Left 55 degrees: Right 58 degrees; Lumbar flexion min limited. 03/02/22= 60d, R 63d Goal status: MET   4.  Pt will demonstrate improved core strength per maintaining a plank from toes for 30 sec Baseline: decreased core strength with eval 02/15/22: able to hold 25 sec plank with LB pain increase after 20 seconds. 03/02/22: 25" limited by weakness  03/06/22: 35 sec  Goal status: MET   5.  Improve 5xSTS by MCID of 5" as indication of improved functional mobility  Baseline: 02/06/22: 14.6 sec 02/15/22: 8.5 sec  Goal status: MET    6.  Pt's  Mod Oswestry score will improve by the MCID of 11 points as indication of improved function  Baseline: 02/06/22: 5/50 or 10% disability 02/15/22: 5/50 or 10% disability Goal status: DEFERRED   PLAN:   PT FREQUENCY: 1x/week   PT DURATION: 4 weeks   PLANNED INTERVENTIONS: Therapeutic exercises, Therapeutic activity, Patient/Family education, Self Care, Dry Needling, Electrical stimulation, Spinal manipulation, Spinal mobilization, Cryotherapy, Moist heat, Taping, Traction, Ultrasound, Ionotophoresis '4mg'$ /ml Dexamethasone, Manual therapy, and Re-evaluation.   PLAN FOR NEXT SESSION: N/A discharge to HEP today  PHYSICAL THERAPY DISCHARGE SUMMARY   Hessie Diener, PTA 03/06/22 8:42 AM Phone: [phone] Fax: [phone]   PHYSICAL THERAPY DISCHARGE SUMMARY  Visits from Start of Care: 8  Current functional level related to goals / functional outcomes: See clinical impression and PT goals    Remaining deficits: See clinical impression and PT goals    Education / Equipment: HEP   Patient agrees to discharge. Patient goals were met. Patient is being discharged due to being pleased with the current functional level.   Allen Ralls MS, PT 04/04/22 10:49 AM

## 2022-03-07 ENCOUNTER — Encounter: Payer: Self-pay | Admitting: Family Medicine

## 2022-03-07 ENCOUNTER — Ambulatory Visit: Payer: Medicaid Other | Admitting: Family Medicine

## 2022-03-07 VITALS — BP 125/85 | HR 89 | Ht 76.0 in | Wt 180.6 lb

## 2022-03-07 DIAGNOSIS — I10 Essential (primary) hypertension: Secondary | ICD-10-CM | POA: Diagnosis not present

## 2022-03-07 DIAGNOSIS — K219 Gastro-esophageal reflux disease without esophagitis: Secondary | ICD-10-CM | POA: Diagnosis not present

## 2022-03-07 NOTE — Progress Notes (Signed)
    SUBJECTIVE:   CHIEF COMPLAINT / HPI:   Stomach pain No relief since last visit. He has been taking protonix 40 mg daily, though this does not help. Feels the pain in epigastric region as well as to the left of the epigastric area. Pain feels like a scratching sensation. Pain is worse when lying down, and this sometimes interrupts his sleep. He eats food that is sometimes spicy and feels worse after this. He is not able to "digest" food correctly, and he has made himself vomit at times because he needed to get all of the food out. He has noticed a small amount of blood in his vomit after these episodes, the most recent time day before yesterday. No blood in his stool (though GI notes history in their chart). Only other bleeding is when he blows his nose, about 2-3x week and worse in winter. He has not had bowel changes. He has felt dizzy recently and more cold than usual. Has seen GI before who recommended EGD. Cardiac workup in Sept 2022 was negative.  OBJECTIVE:   BP 125/85   Pulse 89   Ht 6\' 4"  (1.93 m)   Wt 180 lb 9.6 oz (81.9 kg)   SpO2 100%   BMI 21.98 kg/m   General: Alert and oriented, in NAD Skin: Warm, dry, and intact without lesions HEENT: NCAT, EOM grossly normal, midline nasal septum Cardiac/Chest: RRR, no m/r/g appreciated, mild tenderness to bilateral chest walls Respiratory: CTAB, breathing and speaking comfortably on RA Abdominal: Soft, mildly tender to palpation diffusely with potential increased tenderness in the epigastric area, nondistended, normoactive bowel sounds Extremities: Moves all extremities grossly equally Neurological: No gross focal deficit Psychiatric: Appropriate mood and affect  ASSESSMENT/PLAN:   GERD (gastroesophageal reflux disease) Uncontrolled.  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  Given progression of symptoms and now blood loss,  recommended following up with gastroenterology for EGD; provided number for [facility].  He is hemodynamically stable today and reports only small amounts of blood loss, so feel outpatient workup is appropriate.  Will also obtain CBC today to assess hemoglobin.  Consider deficient PPI metabolism/adherence (though reports daily use) versus gastroparesis versus mass lesion versus psychosocial etiology (recommend depression and anxiety screening at further visits) for progression of disease. Will follow-up EGD results.  Primary hypertension Controlled today.  Patient reports not taking amlodipine for over a month.  Given controlled today, we will not refill the low-dose amlodipine but do recommend recheck at next visit and reinitiation if needed.   Ethelene Hal, MD [place]

## 2022-03-07 NOTE — Assessment & Plan Note (Signed)
Controlled today.  Patient reports not taking amlodipine for over a month.  Given controlled today, we will not refill the low-dose amlodipine but do recommend recheck at next visit and reinitiation if needed.

## 2022-03-07 NOTE — Patient Instructions (Addendum)
It was great to see you today! Here's what we talked about:  I feel your symptoms are likely due to gastric reflux disease (GERD). Continue taking protonix for now.  We will get some blood work today to monitor your hemoglobin levels. Follow up with the stomach doctors at  [phone] to make an appointment for the camera down your throat to look at your esophagus and stomach. Please call us or go to the emergency room if you continue to vomit a lot of blood, feel more dizzy, or are overall feeling worse.  Please let me know if you have any other questions.  Dr. Marcha Dutton

## 2022-03-07 NOTE — Assessment & Plan Note (Addendum)
Uncontrolled.  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  Given progression of symptoms and now blood loss, recommended following up with gastroenterology for EGD; provided number for [facility].  He is hemodynamically stable today and reports only small amounts of blood loss, so feel outpatient workup is appropriate.  Will also obtain CBC today to assess hemoglobin.  Consider deficient PPI metabolism/adherence (though reports daily use) versus gastroparesis versus mass lesion versus psychosocial etiology (recommend depression and anxiety screening at further visits) for progression of disease. Will follow-up EGD results.

## 2022-03-08 LAB — CBC WITH DIFFERENTIAL/PLATELET
Basophils Absolute: 0 10*3/uL (ref 0.0–0.2)
Basos: 0 %
EOS (ABSOLUTE): 0 10*3/uL (ref 0.0–0.4)
Eos: 0 %
Hematocrit: 47.8 % (ref 37.5–51.0)
Hemoglobin: 16.2 g/dL (ref 13.0–17.7)
Immature Grans (Abs): 0 10*3/uL (ref 0.0–0.1)
Immature Granulocytes: 0 %
Lymphocytes Absolute: 1 10*3/uL (ref 0.7–3.1)
Lymphs: 29 %
MCH: 28.9 pg (ref 26.6–33.0)
MCHC: 33.9 g/dL (ref 31.5–35.7)
MCV: 85 fL (ref 79–97)
Monocytes Absolute: 0.6 10*3/uL (ref 0.1–0.9)
Monocytes: 17 %
Neutrophils Absolute: 1.8 10*3/uL (ref 1.4–7.0)
Neutrophils: 54 %
Platelets: 120 10*3/uL — ABNORMAL LOW (ref 150–450)
RBC: 5.6 x10E6/uL (ref 4.14–5.80)
RDW: 12.7 % (ref 11.6–15.4)
WBC: 3.4 10*3/uL (ref 3.4–10.8)

## 2022-03-13 ENCOUNTER — Ambulatory Visit: Payer: Medicaid Other

## 2022-04-03 ENCOUNTER — Encounter: Payer: Self-pay | Admitting: Family Medicine

## 2022-04-03 ENCOUNTER — Ambulatory Visit (INDEPENDENT_AMBULATORY_CARE_PROVIDER_SITE_OTHER): Payer: Medicaid Other | Admitting: Family Medicine

## 2022-04-03 VITALS — BP 142/95 | HR 81 | Ht 76.0 in | Wt 185.6 lb

## 2022-04-03 DIAGNOSIS — M940 Chondrocostal junction syndrome [Tietze]: Secondary | ICD-10-CM | POA: Diagnosis not present

## 2022-04-03 MED ORDER — DICLOFENAC SODIUM 1 % EX GEL: 4.0000 g | Freq: Four times a day (QID) | CUTANEOUS | 0 refills | Status: DC

## 2022-04-03 MED ORDER — MELOXICAM 15 MG PO TABS: 15.0000 mg | ORAL_TABLET | Freq: Every day | ORAL | 0 refills | Status: DC

## 2022-04-03 NOTE — Patient Instructions (Addendum)
????? ????? ?? ?????? ???????? ?? ?????? ??????? ?????? ??? ???????? ??????? ??? ?????????? ???? ???? ?? ????? ?? ????? ?????. ??? ?? ???? ??????? ???   3-5 ???? ????? ???? 2 ??????   ???????   You can look up exercises to help with costochondritis online and they will give you some stretches that can help with the pain. I would recommend doing them 3-5 times per day for the next 2 weeks

## 2022-04-03 NOTE — Progress Notes (Signed)
    SUBJECTIVE:   CHIEF COMPLAINT / HPI:   Arabic interpreter used via iPad for entire encounter  Pain in left ribs - Present for the last month, no known injury or sickness - Feels that his pain is getting worse on the left side but is also present on the right - Pain is constant and stabbing, has woken him from sleep when turning in his sleep - Does not take anything for the pain - Will have some mild associated shortness of breath when the pain becomes intense - Is not typically related to walking, but does get worse with arm movements  PERTINENT  PMH / PSH: Latent TB, chronic bilateral lower back pain  OBJECTIVE:   Ht '6\' 4"'$  (1.93 m)   Wt 185 lb 9.6 oz (84.2 kg)   BMI 22.59 kg/m   Gen: well-appearing, NAD CV: RRR, no m/r/g appreciated, no peripheral edema Pulm: CTAB, no wheezes/crackles GI: soft, non-tender, non-distended MSK: TTP in the costochondral region and along the ribs bilaterally (L>R), pain exacerbated with deep inspirations on exam   ASSESSMENT/PLAN:   Costochondritis Physical exam and history most consistent with costochondritis. Low suspicion for cardiac or pulmonary etiology given the reproducibility with palpation of the costochondral angles. Patient with language barrier to taking medications so feel that prescription of NSAID is appropriate.  - Meloxicam '15mg'$  daily x14 days - Tylenol PRN - Voltaren gel PRN - Recommended specific exercises for costochondritis  - Return if not improving after 2 weeks of treatment   Avyonna Wagoner, [place]

## 2022-04-11 ENCOUNTER — Ambulatory Visit (INDEPENDENT_AMBULATORY_CARE_PROVIDER_SITE_OTHER): Payer: Medicaid Other

## 2022-04-11 ENCOUNTER — Ambulatory Visit: Payer: Medicaid Other | Admitting: Gastroenterology

## 2022-04-11 VITALS — BP 138/94 | HR 81

## 2022-04-11 DIAGNOSIS — S6992XA Unspecified injury of left wrist, hand and finger(s), initial encounter: Secondary | ICD-10-CM

## 2022-04-11 DIAGNOSIS — Z0130 Encounter for examination of blood pressure without abnormal findings: Secondary | ICD-10-CM

## 2022-04-11 NOTE — Progress Notes (Signed)
Patient presents to nurse clinic for BP check. Last Bp on 04/03/22 was 142/95. Patient reports that he has been taking his amlodipine 2.5 mg tablets nightly. He took last dose yesterday evening.   BP today was 138/92, recheck was 138/94.  Patient also reports that he cut his finger with a knife 3 days ago. Asked Dr. Owens Shark to look at area to determine next steps. Patient UTD on Tetanus vaccine with last vaccination on 04/26/2020. She advised dressing the finger with vaseline and gauze wrap and for patient to follow up within the next two days. Dressed finger as directed and scheduled patient follow up with Dr. Zigmund Daniel on Friday, 3/15.  [place] interpreter, Deatra Canter [phone] used for entire visit.   Talbot Grumbling, RN

## 2022-04-13 ENCOUNTER — Ambulatory Visit: Payer: Medicaid Other | Admitting: Student

## 2022-04-13 ENCOUNTER — Encounter: Payer: Self-pay | Admitting: Student

## 2022-04-13 VITALS — BP 124/80 | HR 80 | Ht 76.0 in | Wt 181.4 lb

## 2022-04-13 DIAGNOSIS — S6992XA Unspecified injury of left wrist, hand and finger(s), initial encounter: Secondary | ICD-10-CM | POA: Diagnosis not present

## 2022-04-13 HISTORY — DX: Unspecified injury of left wrist, hand and finger(s), initial encounter: S69.92XA

## 2022-04-13 NOTE — Assessment & Plan Note (Signed)
Healing 5 days since injury.  Up-to-date on tetanus vaccine.  No systemic symptoms.  Continue to keep moist with Vaseline, provided Vaseline tube in office.  Also provided patient with bandaids to keep covered while healing.  Will allow for healing through secondary intention.  Provided strict return precautions.

## 2022-04-13 NOTE — Progress Notes (Cosign Needed Addendum)
  SUBJECTIVE:   CHIEF COMPLAINT / HPI:   Arabic interpreter utilized during discussion   Finger Injury -Patient cut his left thumb with a knife 5 days ago -UTD on Tetanus 04/26/20 -Wrapped in vasoline and gauze wrap from hospital visit 2 days ago but no longer has gauze to wrap this with  -Denies fever or chills  -Normal motion of the finger  -Feels some pain when in the shower and water hits the area    PERTINENT  PMH / PSH: HTN, latent TB, GERD, chronic back pain, h/o tobacco use    Patient Care Team: Rise Patience, DO as PCP - General (Family Medicine) OBJECTIVE:  BP 124/80   Pulse 80   Ht 6\' 4"  (1.93 m)   Wt 181 lb 6.4 oz (82.3 kg)   SpO2 98%   BMI 22.08 kg/m  Physical Exam  General: NAD, pleasant, able to participate in exam Card: RRR no m/g/r Respiratory: No respiratory distress Skin: warm and dry, no rashes noted Psych: Normal affect and mood Finger: Open area on the MCP left thumb with granulation, no drainage, fluctuance, erythema in surrounding tissue. Radial pulse palpable. FROM of finger. Cap refill brisk    ASSESSMENT/PLAN:  Injury of left thumb, initial encounter Assessment & Plan: Healing 5 days since injury.  Up-to-date on tetanus vaccine.  No systemic symptoms.  Continue to keep moist with Vaseline, provided Vaseline tube in office.  Also provided patient with bandaids to keep covered while healing.  Will allow for healing through secondary intention.  Provided strict return precautions.     Had costochondritis on last visit, was given Meloxicam. He reports significant improvement of symptoms. Does not need to return for this unless worsening.    Return if symptoms worsen or fail to improve. [name], MD 04/13/2022, 8:57 AM PGY-2, [place]

## 2022-04-13 NOTE — Patient Instructions (Addendum)
It was great to see you today! Thank you for choosing [facility] for your primary care. Shawn Stark was seen for finger injury.  Today we addressed: Keep the thumb covered in vasoline, using the tube I provided and place a band aid over the area daily to keep moist  Tylenol for pain Do this until healed  Please return if you notice swelling, redness, worsening pain, warmth, fever, chills, difficulty moving the finger  If you haven't already, sign up for My Chart to have easy access to your labs results, and communication with your primary care physician.  I recommend that you always bring your medications to each appointment as this makes it easy to ensure you are on the correct medications and helps [place] not miss refills when you need them. Call the clinic at [phone] if your symptoms worsen or you have any concerns.  You should return to our clinic Return if symptoms worsen or fail to improve. Please arrive 15 minutes before your appointment to ensure smooth check in process.  We appreciate your efforts in making this happen.  Thank you for allowing me to participate in your care, Shawn Emery, MD 04/13/2022, 8:47 AM PGY-2, [place]

## 2022-05-09 ENCOUNTER — Ambulatory Visit (INDEPENDENT_AMBULATORY_CARE_PROVIDER_SITE_OTHER): Payer: BLUE CROSS/BLUE SHIELD | Admitting: Internal Medicine

## 2022-05-09 ENCOUNTER — Encounter: Payer: Self-pay | Admitting: Internal Medicine

## 2022-05-09 VITALS — BP 130/90 | HR 88 | Wt 182.0 lb

## 2022-05-09 DIAGNOSIS — K219 Gastro-esophageal reflux disease without esophagitis: Secondary | ICD-10-CM | POA: Diagnosis not present

## 2022-05-09 DIAGNOSIS — R1013 Epigastric pain: Secondary | ICD-10-CM

## 2022-05-09 DIAGNOSIS — K5900 Constipation, unspecified: Secondary | ICD-10-CM

## 2022-05-09 DIAGNOSIS — K921 Melena: Secondary | ICD-10-CM | POA: Diagnosis not present

## 2022-05-09 NOTE — Progress Notes (Addendum)
Chief Complaint: Epigastric ab pain  HPI : 37 year old male presents with epigastric ab pain  He has had epigastric ab pain for the last year. This has been stable in severity. He notices that when he eats oily and sour foods, this ab pain seems to get worse. When he gets really full or hungry, the pain seems to worsen as well. Denies N&V or dysphagia. Denies hematochezia. Endorses seeing black stools for at least a year. The stools are sticky and tarry. Denies Pepto Bismol or iron supplements. He uses a type of blue pain powder for headaches and constipation. Endorses chest burning and regurgitation. He occasionally regurgitates blood. He does not take any GERD therapies. He does have constipation and on average he has one BM once every week. He tries to manage the constipation with foods but denies not use any laxatives. Denies alcohol use. Denies family history of GI cancers. He is not taking any medications. The pantoprazole did help with his symptoms but he ran out of this medication. He was on meloxicam for 1 year and stopped taking it about 1 month ago.   Wt Readings from Last 3 Encounters:  05/09/22 182 lb (82.6 kg)  04/13/22 181 lb 6.4 oz (82.3 kg)  04/03/22 185 lb 9.6 oz (84.2 kg)   History reviewed. No pertinent past medical history.   History reviewed. No pertinent surgical history. Family History  Problem Relation Age of Onset   Healthy Mother    Heart disease Mother    Healthy Father    Liver disease Neg Hx    Esophageal cancer Neg Hx    Colon cancer Neg Hx    Social History   Tobacco Use   Smoking status: Some Days    Types: Cigarettes   Smokeless tobacco: Never  Vaping Use   Vaping Use: Never used  Substance Use Topics   Alcohol use: Never   Drug use: Never   Current Outpatient Medications  Medication Sig Dispense Refill   amLODipine (NORVASC) 2.5 MG tablet Take 1 tablet (2.5 mg total) by mouth at bedtime. (Patient not taking: Reported on 05/09/2022) 90 tablet  0   baclofen (LIORESAL) 10 MG tablet Take 1 tablet (10 mg total) by mouth 3 (three) times daily. (Patient not taking: Reported on 05/09/2022) 30 each 0   diclofenac Sodium (VOLTAREN) 1 % GEL Apply 4 g topically 4 (four) times daily. (Patient not taking: Reported on 05/09/2022) 350 g 0   meloxicam (MOBIC) 15 MG tablet Take 1 tablet (15 mg total) by mouth daily. (Patient not taking: Reported on 05/09/2022) 30 tablet 0   methocarbamol (ROBAXIN) 500 MG tablet Take 0.5 tablets (250 mg total) by mouth at bedtime as needed for muscle spasms. (Patient not taking: Reported on 05/09/2022) 30 tablet 2   pantoprazole (PROTONIX) 40 MG tablet Take 1 tablet (40 mg total) by mouth daily. (Patient not taking: Reported on 05/09/2022) 30 tablet 3   pregabalin (LYRICA) 50 MG capsule Take 1 capsule (50 mg total) by mouth 2 (two) times daily. 60 capsule 0   Wheat Dextrin (BENEFIBER) POWD Take 1 Scoop by mouth daily. (Patient not taking: Reported on 05/09/2022)  0   Current Facility-Administered Medications  Medication Dose Route Frequency Provider Last Rate Last Admin   0.9 %  sodium chloride infusion  500 mL Intravenous Once Imogene Burn, MD       No Known Allergies   Review of Systems: All systems reviewed and negative except where noted in  HPI.   Physical Exam: BP (!) 130/90   Pulse 88   Wt 182 lb (82.6 kg)   BMI 22.15 kg/m  Constitutional: Pleasant,well-developed, male in no acute distress. HEENT: Normocephalic and atraumatic. Conjunctivae are normal. No scleral icterus. Cardiovascular: Normal rate Pulmonary/chest: Effort normal Abdominal: Soft, nondistended, tender in the epigastric area. Bowel sounds active throughout. There are no masses palpable. No hepatomegaly. Extremities: No edema Neurological: Alert and oriented to person place and time. Skin: Skin is warm and dry. No rashes noted. Psychiatric: Normal mood and affect. Behavior is normal.  Labs 07/2020: TSH nml  Labs 08/2020: Lipase nml. CMP  unremarkable. CBC unremarkable.  Labs 12/2021: H pylori breath test negative.  Labs 03/2022: CBC with low plts of 120.   TTE 08/22/20: 1. Left ventricular ejection fraction, by estimation, is 60 to 65%. The left ventricle has normal function. The left ventricle has no regional wall motion abnormalities. Left ventricular diastolic parameters were  normal.   2. Right ventricular systolic function is normal. The right ventricular size is normal.   3. The mitral valve is grossly normal. Trivial mitral valve  regurgitation.   4. The aortic valve is tricuspid. Aortic valve regurgitation is not visualized.   RUQ U/S 09/20/20: IMPRESSION: Unremarkable RIGHT upper quadrant sonogram. No signs of acute biliary process.  ASSESSMENT AND PLAN: Epigastric ab pain Melena GERD Constipation Patient presents with a year of epigastric ab pain and melena. He has uncontrolled GERD as well as constipation, which may be causing his symptoms. He also endorses NSAID use with meloxicam and some type of pain relief powder. His work up thus far has shown nml LFTs, nml lipase, negative H pylori breath test, and negative RUQ U/S. Will plan for EGD for further evaluation to look for esophagitis, PUD, and gastritis/duodenitis. Will also plan to rule out malignancy. - GERD handout  - Drink 8 cups of water per day, walk 30 min per day, take daily fiber supplement with Benefiber - Start daily Miralax - EGD [facility]  Eulah Pont, MD  I spent 45 minutes of time, including in depth chart review, independent review of results as outlined above, communicating results with the patient directly, face-to-face time with the patient, coordinating care, ordering studies and medications as appropriate, and documentation.

## 2022-05-09 NOTE — Patient Instructions (Addendum)
You have been scheduled for an endoscopy. Please follow written instructions given to you at your visit today. If you use inhalers (even only as needed), please bring them with you on the day of your procedure.   _______________________________________________________  If your blood pressure at your visit was 140/90 or greater, please contact your primary care physician to follow up on this.  _______________________________________________________  If you are age 36 or older, your body mass index should be between 23-30. Your Body mass index is 22.15 kg/m. If this is out of the aforementioned range listed, please consider follow up with your Primary Care Provider.  If you are age 36 or younger, your body mass index should be between 19-25. Your Body mass index is 22.15 kg/m. If this is out of the aformentioned range listed, please consider follow up with your Primary Care Provider.   ________________________________________________________  The [facility] providers would like to encourage you to use [facility] to communicate with providers for non-urgent requests or questions.  Due to long hold times on the telephone, sending your provider a message by [facility] may be a faster and more efficient way to get a response.  Please allow 48 business hours for a response.  Please remember that this is for non-urgent requests.  _______________________________________________________   Drink 8 cups of water a day and walk 30 minutes a day.  Please purchase the following medications over the counter and take as directed: Fiber supplement such as Benefiber- use as directed daily Miralax: Take as  directed up to 3 times a day to achieve regular bowel movements   Due to recent changes in healthcare laws, you may see the results of your imaging and laboratory studies on MyChart before your provider has had a chance to review them.  We understand that in some cases there may be results that are confusing or  concerning to you. Not all laboratory results come back in the same time frame and the provider may be waiting for multiple results in order to interpret others.  Please give [place] 48 hours in order for your provider to thoroughly review all the results before contacting the office for clarification of your results.    Thank you for entrusting me with your care and for choosing [facility], Dr. Eulah Pont

## 2022-06-04 ENCOUNTER — Other Ambulatory Visit: Payer: Self-pay | Admitting: Pharmacist

## 2022-06-04 NOTE — Progress Notes (Signed)
Error. Duplicate note. Disregard.

## 2022-06-04 NOTE — Progress Notes (Signed)
Duplicate Note: Disregard

## 2022-06-05 ENCOUNTER — Ambulatory Visit: Payer: BLUE CROSS/BLUE SHIELD | Admitting: Family Medicine

## 2022-06-05 ENCOUNTER — Ambulatory Visit (INDEPENDENT_AMBULATORY_CARE_PROVIDER_SITE_OTHER): Payer: Medicaid Other | Admitting: Family Medicine

## 2022-06-05 VITALS — BP 134/86 | HR 74 | Ht 76.0 in | Wt 184.0 lb

## 2022-06-05 DIAGNOSIS — G8929 Other chronic pain: Secondary | ICD-10-CM | POA: Diagnosis not present

## 2022-06-05 DIAGNOSIS — M62838 Other muscle spasm: Secondary | ICD-10-CM | POA: Diagnosis not present

## 2022-06-05 DIAGNOSIS — M549 Dorsalgia, unspecified: Secondary | ICD-10-CM

## 2022-06-05 MED ORDER — METHOCARBAMOL 500 MG PO TABS: ORAL_TABLET | ORAL | 2 refills | Status: DC

## 2022-06-05 MED ORDER — MELOXICAM 15 MG PO TABS
ORAL_TABLET | ORAL | 0 refills | Status: DC
Start: 2022-06-05 — End: 2022-08-09

## 2022-06-05 NOTE — Progress Notes (Signed)
    SUBJECTIVE:   CHIEF COMPLAINT / HPI:   Arabic interpreter Zineb ID [id number] used for entire appointment.  Back pain Originally scheduled to see Dr. Clayborne Artist today, however given late arrival was rescheduled with myself and access to care today. Does have diagnoses on problem list of chronic back pain, last seen 01/03/2022 for this.  Home, he was instructed to continue pregabalin twice daily and was referred to PT. Chart review indicates that he was able to receive PT at [facility] outpatient orthopedic rehabilitation, has on file 9 PT visits spanning 01/18/2022 through 03/06/2022.  Today reports pain greatly improved with PT. However, mow it has returned in form of muscle spasm in multiple places across back and thighs. Reports intermittent pulling squeezing sensation he describes as a "nerve spasm".  No association with bending, lifting, twisting, or position change.  Reports sometimes worse with weather changes.  With onset of pain, he feels weak in the legs but denies any gait disturbances or falls.  No red flags.  PERTINENT  PMH / PSH:  Patient Active Problem List   Diagnosis Date Noted   Primary hypertension 10/16/2021    Priority: 1.   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  Obvious muscle spasm of left lumbar paraspinals.  Global TTP of  paraspinals bilaterally. Neuro: BLE strength 5/5 and equal bilaterally of hip, knee, and ankles  ASSESSMENT/PLAN:   Chronic back pain Subacute intermittent muscle spasm related muscular pain of back.  No red flags on exam today.  Given atraumatic onset, less likely osseous abnormality such as fracture.  Normal neuro exam, less likely neurological insult. Return precautions given. - 5 days of daily meloxicam and Robaxin; afterwards PRN use only - After 5 days of medication, began back shift as he went to PT - Back stretches handout given - Also discussed heat, ice, and massage     Fayette Pho, MD [facility]

## 2022-06-05 NOTE — Assessment & Plan Note (Signed)
Subacute intermittent muscle spasm related muscular pain of back.  No red flags on exam today.  Given atraumatic onset, less likely osseous abnormality such as fracture.  Normal neuro exam, less likely neurological insult. Return precautions given. - 5 days of daily meloxicam and Robaxin; afterwards PRN use only - After 5 days of medication, began back shift as he went to PT - Back stretches handout given - Also discussed heat, ice, and massage

## 2022-06-05 NOTE — Patient Instructions (Addendum)
I have translated the following text using Google translate.  As such, there are many errors.  I apologize for the poor written translation; however, we do not have written  translation services yet. It was wonderful to see you today. Thank you for allowing me to be a part of your care. Below is a short summary of what we discussed at your visit today: ??? ??? ?????? ???? ?????? ???????? ????? ????. ???? ??? ?????? ???? ?????? ?? ???????. ????? ?? ??????? ???????? ??????? ??? ???? ??? ????? ????? ????? ?????? ??? ????. ??? ?? ?????? ????? ?????. ????? ?? ??? ?????? ?? ??? ???? ????? ?? ??????. ????? ??? ???? ???? ??? ??????? ???? ?????? ?????:  Muscle spasm of back Your exam today in clinic shows muscle spasm of the back.  Your nerve exam normal, meaning I do not believe you have an abnormality of your spine or nerves.  I believe this is all a muscle issue.  Take the 2 medications (muscle relaxer called Robaxin, anti-inflammatory called meloxicam) once daily for 5 days.  After that, use only as needed.  Use very sparingly.  After the 5-day course of medication, use the back stretches he learned at physical therapy.  Also intermittently apply ice, heat, and massage to the area.  Return to clinic if your back pain is not improved. ???? ????? ????? ???? ????? ?? ??????? ???? ???? ????? ?????. ??? ?????? ?????? ??? ???? ???? ?? ????? ?? ???? ????? ?? ????? ?????? ?? ??????. ????? ?? ??? ???? ????? ?????.  ????? ???????? (???? ??????? ?????? Robaxin? ????? ???????? ?????? meloxicam) ??? ????? ?????? ???? 5 ????. ??? ???? ?????? ??? ??? ??????. ?????? ??????? ????.  ??? ???? ?????? ???? 5 ????? ?????? ?????? ???? ????? ???? ?????? ?? ?????? ???????. ?? ????? ???? ????? ???????? ???????? ???? ????? ??? ???????.  ?? ??????? ??? ??????? ??? ?? ????? ??? ????.   Please bring all of your medications to every appointment! If you have any questions or concerns, please do not hesitate to contact us via phone  or MyChart message.  ???? ????? ???? ??????? ?????? ?? ?? ?? ????! ??? ???? ???? ??? ????? ?? ?????????? ??? ????? ?? ??????? ??? ??? ?????? ?? ??? ????? MyChart.  Fayette Pho, MD

## 2022-06-12 ENCOUNTER — Encounter: Payer: Self-pay | Admitting: Internal Medicine

## 2022-06-12 ENCOUNTER — Ambulatory Visit: Payer: Medicaid Other | Admitting: Internal Medicine

## 2022-06-12 VITALS — BP 147/103 | HR 86 | Temp 98.9°F | Resp 16 | Ht 71.5 in | Wt 181.0 lb

## 2022-06-12 DIAGNOSIS — K2970 Gastritis, unspecified, without bleeding: Secondary | ICD-10-CM | POA: Diagnosis not present

## 2022-06-12 DIAGNOSIS — K209 Esophagitis, unspecified without bleeding: Secondary | ICD-10-CM | POA: Diagnosis not present

## 2022-06-12 DIAGNOSIS — K2100 Gastro-esophageal reflux disease with esophagitis, without bleeding: Secondary | ICD-10-CM

## 2022-06-12 DIAGNOSIS — K219 Gastro-esophageal reflux disease without esophagitis: Secondary | ICD-10-CM | POA: Diagnosis not present

## 2022-06-12 DIAGNOSIS — K2950 Unspecified chronic gastritis without bleeding: Secondary | ICD-10-CM | POA: Diagnosis not present

## 2022-06-12 DIAGNOSIS — K229 Disease of esophagus, unspecified: Secondary | ICD-10-CM | POA: Diagnosis not present

## 2022-06-12 MED ORDER — SODIUM CHLORIDE 0.9 % IV SOLN: 500.0000 mL | INTRAVENOUS | Status: DC

## 2022-06-12 MED ORDER — PANTOPRAZOLE SODIUM 40 MG PO TBEC: 40.0000 mg | DELAYED_RELEASE_TABLET | Freq: Two times a day (BID) | ORAL | 3 refills | Status: DC

## 2022-06-12 NOTE — Patient Instructions (Signed)
YOU HAD AN ENDOSCOPIC PROCEDURE TODAY AT THE [facility]:   Refer to the procedure report that was given to you for any specific questions about what was found during the examination.  If the procedure report does not answer your questions, please call your gastroenterologist to clarify.  If you requested that your care partner not be given the details of your procedure findings, then the procedure report has been included in a sealed envelope for you to review at your convenience later.  YOU SHOULD EXPECT: Some feelings of bloating in the abdomen. Passage of more gas than usual.  Walking can help get rid of the air that was put into your GI tract during the procedure and reduce the bloating. If you had a lower endoscopy (such as a colonoscopy or flexible sigmoidoscopy) you may notice spotting of blood in your stool or on the toilet paper. If you underwent a bowel prep for your procedure, you may not have a normal bowel movement for a few days.  Please Note:  You might notice some irritation and congestion in your nose or some drainage.  This is from the oxygen used during your procedure.  There is no need for concern and it should clear up in a day or so.  SYMPTOMS TO REPORT IMMEDIATELY:   Following upper endoscopy (EGD)  Vomiting of blood or coffee ground material  New chest pain or pain under the shoulder blades  Painful or persistently difficult swallowing  New shortness of breath  Fever of 100F or higher  Black, tarry-looking stools  For urgent or emergent issues, a gastroenterologist can be reached at any hour by calling [phone]. Do not use MyChart messaging for urgent concerns.    DIET:  We do recommend a small meal at first, but then you may proceed to your regular diet.  Drink plenty of fluids but you should avoid alcoholic beverages for 24 hours.  MEDICATIONS: Use Protonix (pantoprazole) 40 mg by mouth twice daily for 10 weeks.  FOLLOW UP: Repeat upper  endoscopy in 10 weeks to check for healing.  Please see handouts given to you by your recovery nurse: Gastritis, Esophagitis.  Thank you for allowing [place] to provide for your healthcare needs today.  ACTIVITY:  You should plan to take it easy for the rest of today and you should NOT DRIVE or use heavy machinery until tomorrow (because of the sedation medicines used during the test).    FOLLOW UP: Our staff will call the number listed on your records the next business day following your procedure.  We will call around 7:15- 8:00 am to check on you and address any questions or concerns that you may have regarding the information given to you following your procedure. If we do not reach you, we will leave a message.     If any biopsies were taken you will be contacted by phone or by letter within the next 1-3 weeks.  Please call us at [phone] if you have not heard about the biopsies in 3 weeks.    SIGNATURES/CONFIDENTIALITY: You and/or your care partner have signed paperwork which will be entered into your electronic medical record.  These signatures attest to the fact that that the information above on your After Visit Summary has been reviewed and is understood.  Full responsibility of the confidentiality of this discharge information lies with you and/or your care-partner.

## 2022-06-12 NOTE — Progress Notes (Signed)
Patient in recovery post upper endoscopy, BP very high upon admission with diastolic of 125, patient told CRNA he ran out of his BP med months ago and has not been back to his PCP to get a refill, CRNA has explained to him the importance of following up with PCP and taking his BP medication, I have gone into more detail here in recovery regarding the physiology of hypertension and the importance of taking his meds, patient verbalizes understanding (through arabic interpreter here in recovery) and states he will f/u and restart his BP meds.

## 2022-06-12 NOTE — Progress Notes (Signed)
Pt. states no medical or surgical changes since previsit or office visit. 

## 2022-06-12 NOTE — Progress Notes (Unsigned)
GASTROENTEROLOGY PROCEDURE H&P NOTE   Primary Care Physician: Evelena Leyden, DO    Reason for Procedure:   Epigastric ab pain, melena, GERD  Plan:    EGD  Patient is appropriate for endoscopic procedure(s) in the ambulatory ([facility]) setting.  The nature of the procedure, as well as the risks, benefits, and alternatives were carefully and thoroughly reviewed with the patient. Ample time for discussion and questions allowed. The patient understood, was satisfied, and agreed to proceed.     HPI: Shawn Stark is a 37 y.o. male who presents for EGD for evaluation of epigastric ab pain, melena, GERD .  Patient was most recently seen in the Gastroenterology Clinic on 05/09/22.  No interval change in medical history since that appointment. Please refer to that note for full details regarding GI history and clinical presentation.   History reviewed. No pertinent past medical history.  History reviewed. No pertinent surgical history.  Prior to Admission medications   Medication Sig Start Date End Date Taking? Authorizing Provider  amLODipine (NORVASC) 2.5 MG tablet Take 1 tablet (2.5 mg total) by mouth at bedtime. Patient not taking: Reported on 05/09/2022 10/16/21   Lilland, Percival Spanish, DO  baclofen (LIORESAL) 10 MG tablet Take 1 tablet (10 mg total) by mouth 3 (three) times daily. Patient not taking: Reported on 05/09/2022 10/03/21   Jerre Simon, MD  diclofenac Sodium (VOLTAREN) 1 % GEL Apply 4 g topically 4 (four) times daily. Patient not taking: Reported on 05/09/2022 04/03/22   Lilland, Percival Spanish, DO  meloxicam (MOBIC) 15 MG tablet Take once daily for 5 days.  After that, use only as needed.  Use very sparingly. 06/05/22   Fayette Pho, MD  methocarbamol (ROBAXIN) 500 MG tablet Take once daily for 5 days.  After that, use only as needed.  Use very sparingly. 06/05/22   Fayette Pho, MD  pantoprazole (PROTONIX) 40 MG tablet Take 1 tablet (40 mg total) by mouth daily. Patient not taking: Reported  on 05/09/2022 01/03/22   Darral Dash, DO  pregabalin (LYRICA) 50 MG capsule Take 1 capsule (50 mg total) by mouth 2 (two) times daily. 07/17/21 08/16/21  Brimage, Seward Meth, DO  Wheat Dextrin (BENEFIBER) POWD Take 1 Scoop by mouth daily. Patient not taking: Reported on 05/09/2022 10/04/20   Imogene Burn, MD    Current Outpatient Medications  Medication Sig Dispense Refill   amLODipine (NORVASC) 2.5 MG tablet Take 1 tablet (2.5 mg total) by mouth at bedtime. (Patient not taking: Reported on 05/09/2022) 90 tablet 0   baclofen (LIORESAL) 10 MG tablet Take 1 tablet (10 mg total) by mouth 3 (three) times daily. (Patient not taking: Reported on 05/09/2022) 30 each 0   diclofenac Sodium (VOLTAREN) 1 % GEL Apply 4 g topically 4 (four) times daily. (Patient not taking: Reported on 05/09/2022) 350 g 0   meloxicam (MOBIC) 15 MG tablet Take once daily for 5 days.  After that, use only as needed.  Use very sparingly. 30 tablet 0   methocarbamol (ROBAXIN) 500 MG tablet Take once daily for 5 days.  After that, use only as needed.  Use very sparingly. 30 tablet 2   pantoprazole (PROTONIX) 40 MG tablet Take 1 tablet (40 mg total) by mouth daily. (Patient not taking: Reported on 05/09/2022) 30 tablet 3   pregabalin (LYRICA) 50 MG capsule Take 1 capsule (50 mg total) by mouth 2 (two) times daily. 60 capsule 0   Wheat Dextrin (BENEFIBER) POWD Take 1 Scoop by mouth daily. (Patient not taking:  Reported on 05/09/2022)  0   Current Facility-Administered Medications  Medication Dose Route Frequency Provider Last Rate Last Admin   0.9 %  sodium chloride infusion  500 mL Intravenous Once Imogene Burn, MD       0.9 %  sodium chloride infusion  500 mL Intravenous Continuous Imogene Burn, MD        Allergies as of 06/12/2022   (No Known Allergies)    Family History  Problem Relation Age of Onset   Healthy Mother    Heart disease Mother    Healthy Father    Liver disease Neg Hx    Esophageal cancer Neg Hx    Colon  cancer Neg Hx     Social History   Socioeconomic History   Marital status: Married    Spouse name: Not on file   Number of children: Not on file   Years of education: Not on file   Highest education level: Not on file  Occupational History   Occupation: sales  Tobacco Use   Smoking status: Some Days    Types: Cigarettes   Smokeless tobacco: Never  Vaping Use   Vaping Use: Never used  Substance and Sexual Activity   Alcohol use: Never   Drug use: Never   Sexual activity: Not on file  Other Topics Concern   Not on file  Social History Narrative   Not on file   Social Determinants of Health   Financial Resource Strain: Not on file  Food Insecurity: Not on file  Transportation Needs: Not on file  Physical Activity: Not on file  Stress: Not on file  Social Connections: Not on file  Intimate Partner Violence: Not on file    Physical Exam: Vital signs in last 24 hours: BP (!) 178/110   Pulse 92   Temp 98.9 F (37.2 C) (Skin)   Ht 5' 11.5" (1.816 m)   Wt 181 lb (82.1 kg)   SpO2 98%   BMI 24.89 kg/m  GEN: NAD EYE: Sclerae anicteric ENT: MMM CV: Non-tachycardic Pulm: No increased WOB GI: Soft NEURO:  Alert & Oriented   Eulah Pont, MD [facility]   06/12/2022 2:08 PM

## 2022-06-12 NOTE — Progress Notes (Signed)
Interpreter used today at the [facility] for this pt.  Interpreter's name is- [facility].

## 2022-06-12 NOTE — Op Note (Signed)
[facility] Patient Name: Shawn Stark Procedure Date: 06/12/2022 1:55 PM MRN: [id number] Endoscopist: Particia Lather , , [id number] Age: 37 Referring MD:  Date of Birth: January 18, 1986 Gender: Male Account #: [id number] Procedure:                Upper GI endoscopy Indications:              Epigastric abdominal pain, Heartburn, Melena Medicines:                Monitored Anesthesia Care Procedure:                Pre-Anesthesia Assessment:                           - Prior to the procedure, a History and Physical                            was performed, and patient medications and                            allergies were reviewed. The patient's tolerance of                            previous anesthesia was also reviewed. The risks                            and benefits of the procedure and the sedation                            options and risks were discussed with the patient.                            All questions were answered, and informed consent                            was obtained. Prior Anticoagulants: The patient has                            taken no anticoagulant or antiplatelet agents. ASA                            Grade Assessment: III - A patient with severe                            systemic disease. After reviewing the risks and                            benefits, the patient was deemed in satisfactory                            condition to undergo the procedure.                           After obtaining informed consent, the endoscope was  passed under direct vision. Throughout the                            procedure, the patient's blood pressure, pulse, and                            oxygen saturations were monitored continuously. The                            Olympus scope [id number] was introduced through the                            mouth, and advanced to the second part of duodenum.                             The upper GI endoscopy was accomplished without                            difficulty. The patient tolerated the procedure                            well. Scope In: Scope Out: Findings:                 LA Grade C (one or more mucosal breaks continuous                            between tops of 2 or more mucosal folds, less than                            75% circumference) esophagitis with no bleeding was                            found in the distal esophagus. Biopsies were taken                            with a cold forceps for histology.                           Localized mild inflammation characterized by                            congestion (edema), erosions and erythema was found                            in the gastric antrum. Biopsies were taken with a                            cold forceps for histology.                           The examined duodenum was normal. Complications:            No immediate complications. Estimated Blood Loss:     Estimated blood loss was minimal. Impression:               -  LA Grade C reflux esophagitis with no bleeding.                            Biopsied.                           - Gastritis. Biopsied.                           - Normal examined duodenum. Recommendation:           - Discharge patient to home (with escort).                           - Await pathology results.                           - Use Protonix (pantoprazole) 40 mg PO BID for 10                            weeks.                           - Repeat upper endoscopy in 10 weeks to check                            healing.                           - The findings and recommendations were discussed                            with the patient. Dr Particia Lather "[name]" [name],  06/12/2022 2:45:27 PM

## 2022-06-12 NOTE — Progress Notes (Signed)
Called to room to assist during endoscopic procedure.  Patient ID and intended procedure confirmed with present staff. Received instructions for my participation in the procedure from the performing physician.  

## 2022-06-12 NOTE — Progress Notes (Signed)
Vss nad trans to pacu 

## 2022-06-13 ENCOUNTER — Telehealth: Payer: Self-pay | Admitting: *Deleted

## 2022-06-13 NOTE — Telephone Encounter (Signed)
  Follow up Call-     06/12/2022    2:05 PM 10/19/2020    7:25 AM  Call back number  Post procedure Call Back phone  # [phone]  Permission to leave phone message Yes Yes     Patient questions:  Do you have a fever, pain , or abdominal swelling? No. Pain Score  0 *  Have you tolerated food without any problems? Yes.    Have you been able to return to your normal activities? Yes.    Do you have any questions about your discharge instructions: Diet   No. Medications  No. Follow up visit  No.  Do you have questions or concerns about your Care? No.  Actions: * If pain score is 4 or above: No action needed, pain <4.

## 2022-06-14 ENCOUNTER — Encounter: Payer: Self-pay | Admitting: Family Medicine

## 2022-06-14 ENCOUNTER — Ambulatory Visit: Payer: Medicaid Other | Admitting: Family Medicine

## 2022-06-14 VITALS — BP 130/80 | HR 98 | Ht 76.0 in | Wt 183.5 lb

## 2022-06-14 DIAGNOSIS — I10 Essential (primary) hypertension: Secondary | ICD-10-CM | POA: Diagnosis not present

## 2022-06-14 MED ORDER — AMLODIPINE BESYLATE 5 MG PO TABS
5.0000 mg | ORAL_TABLET | Freq: Every day | ORAL | 0 refills | Status: DC
Start: 2022-06-14 — End: 2022-06-28

## 2022-06-14 NOTE — Assessment & Plan Note (Addendum)
BP elevated in the office, has not taken amlodipine in several months. Given the elevations recently, will restart the medication today at a 5mg  dose instead.  - Amlodipine 5mg  daily - Recommended getting BP cuff for home and keeping a log - BMP today  - Follow-up scheduled for 2 weeks - Discussed hypotensive symptoms and return precautions

## 2022-06-14 NOTE — Progress Notes (Signed)
    SUBJECTIVE:   CHIEF COMPLAINT / HPI:   HTN: - Medications prescribed: amlodipine 2.5mg  daily  - Compliance: not taking medications - Checking BP at home: no - Denies any SOB, CP, vision changes, LE edema, medication SEs, or symptoms of hypotension  PERTINENT  PMH / PSH: Reviewed  OBJECTIVE:   BP 130/80   Pulse 98   Ht 6\' 4"  (1.93 m)   Wt 183 lb 8 oz (83.2 kg)   SpO2 100%   BMI 22.34 kg/m   Gen: well-appearing, NAD CV: RRR, no m/r/g appreciated, no peripheral edema Pulm: CTAB, no wheezes/crackles GI: soft, non-tender, non-distended  ASSESSMENT/PLAN:   Primary hypertension BP elevated in the office, has not taken amlodipine in several months. Given the elevations recently, will restart the medication today at a 5mg  dose instead.  - Amlodipine 5mg  daily - Recommended getting BP cuff for home and keeping a log - BMP today  - Follow-up scheduled for 2 weeks - Discussed hypotensive symptoms and return precautions     Evelena Leyden, DO [facility]

## 2022-06-14 NOTE — Patient Instructions (Signed)
I am sending in a new medication called amlodipine for your blood pressure.  We are going to follow-up in 2 weeks to see how your blood pressure is doing.  If you get a blood pressure cuff for home, make sure to sit in a quiet area for a few minutes before checking it.  Keep a log of the measurements and bring it to your next visit.

## 2022-06-15 ENCOUNTER — Encounter: Payer: Self-pay | Admitting: Family Medicine

## 2022-06-15 ENCOUNTER — Encounter: Payer: Self-pay | Admitting: Internal Medicine

## 2022-06-15 LAB — BASIC METABOLIC PANEL
BUN/Creatinine Ratio: 18 (ref 9–20)
BUN: 14 mg/dL (ref 6–20)
CO2: 19 mmol/L — ABNORMAL LOW (ref 20–29)
Calcium: 9.3 mg/dL (ref 8.7–10.2)
Chloride: 104 mmol/L (ref 96–106)
Creatinine, Ser: 0.77 mg/dL (ref 0.76–1.27)
Glucose: 155 mg/dL — ABNORMAL HIGH (ref 70–99)
Potassium: 3.7 mmol/L (ref 3.5–5.2)
Sodium: 139 mmol/L (ref 134–144)
eGFR: 119 mL/min/{1.73_m2} (ref >59)

## 2022-06-28 ENCOUNTER — Ambulatory Visit: Payer: Medicaid Other | Admitting: Family Medicine

## 2022-06-28 ENCOUNTER — Encounter: Payer: Self-pay | Admitting: Family Medicine

## 2022-06-28 VITALS — BP 124/78 | HR 85 | Wt 190.0 lb

## 2022-06-28 DIAGNOSIS — I10 Essential (primary) hypertension: Secondary | ICD-10-CM

## 2022-06-28 MED ORDER — AMLODIPINE BESYLATE 5 MG PO TABS: 5.0000 mg | ORAL_TABLET | Freq: Every day | ORAL | 1 refills | Status: DC

## 2022-06-28 NOTE — Progress Notes (Signed)
    SUBJECTIVE:   CHIEF COMPLAINT / HPI:   HTN: - Medications: amlodipine 5mg  daily - Compliance: good - Checking BP at home: No, doesn't have  BP cuff - Denies any SOB, CP, vision changes, LE edema, medication SEs, or symptoms of hypotension  PERTINENT  PMH / PSH: Reviewed  OBJECTIVE:   BP 124/78   Pulse 85   Wt 190 lb (86.2 kg)   SpO2 98%   BMI 23.13 kg/m   Gen: well-appearing, NAD CV: RRR, no m/r/g appreciated, no peripheral edema Pulm: CTAB, no wheezes/crackles  ASSESSMENT/PLAN:   Primary hypertension BP improved after initiation of amlodipine 5mg  daily. Doesn't have BP cuff but denies any hypotensive symptoms. - Continue amlodipine 5mg  daily - Reviewed labs with patient from last visit - Follow-up in 3 months      Shawn Footman, DO [facility]

## 2022-06-28 NOTE — Patient Instructions (Signed)
Your blood pressure looks great today. We are going to follow-up in 3 months.

## 2022-06-28 NOTE — Assessment & Plan Note (Signed)
BP improved after initiation of amlodipine 5mg  daily. Doesn't have BP cuff but denies any hypotensive symptoms. - Continue amlodipine 5mg  daily - Reviewed labs with patient from last visit - Follow-up in 3 months

## 2022-07-20 IMAGING — CR DG LUMBAR SPINE COMPLETE 4+V
5 series · 5 of 5 positions shown · non-contrast
Comparison: None.

CLINICAL DATA: Chronic back pain.

EXAM:
LUMBAR SPINE - COMPLETE 4+ VIEW

[w lumbar spine ap]
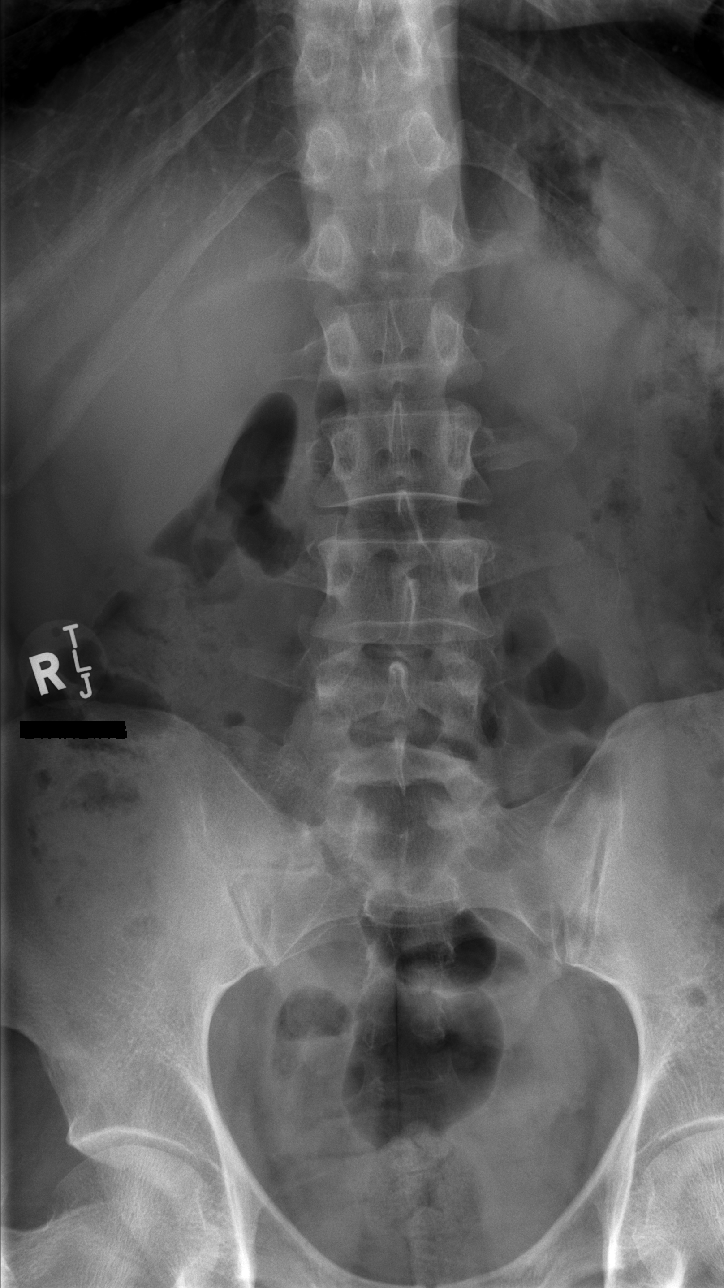

[w lumbar spine obl (1 of 2)]
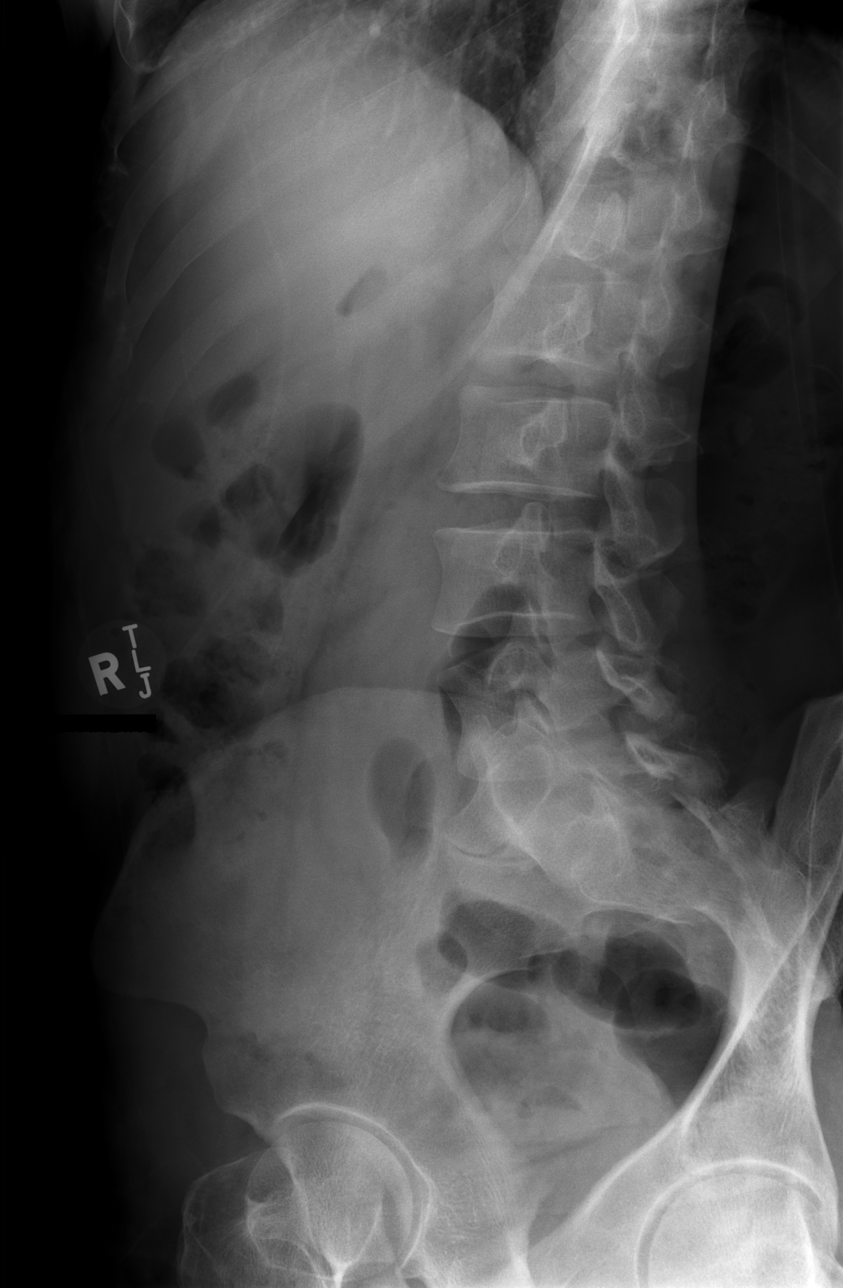

[w lumbar spine obl (2 of 2)]
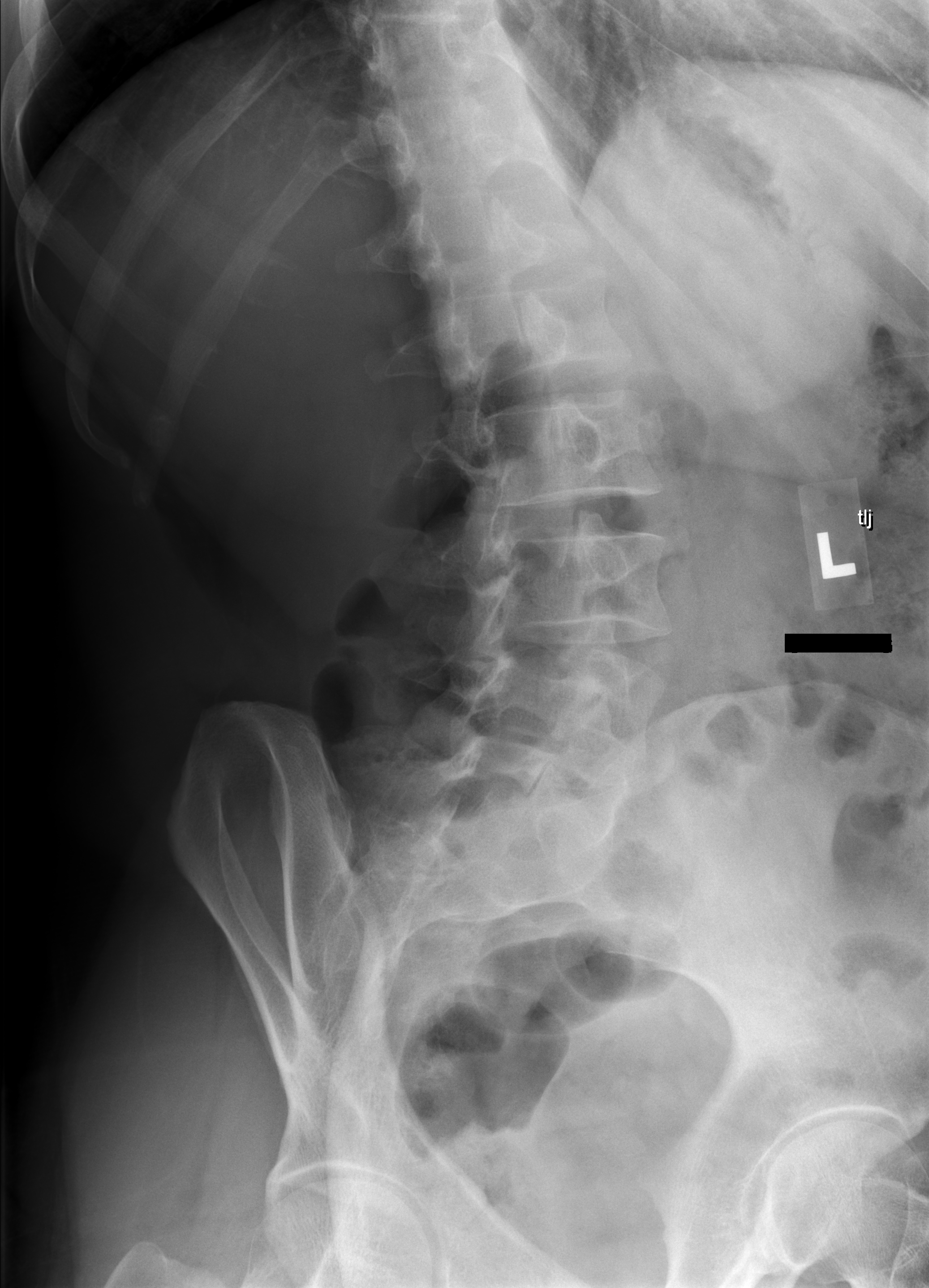

[w lumbar spine lat]
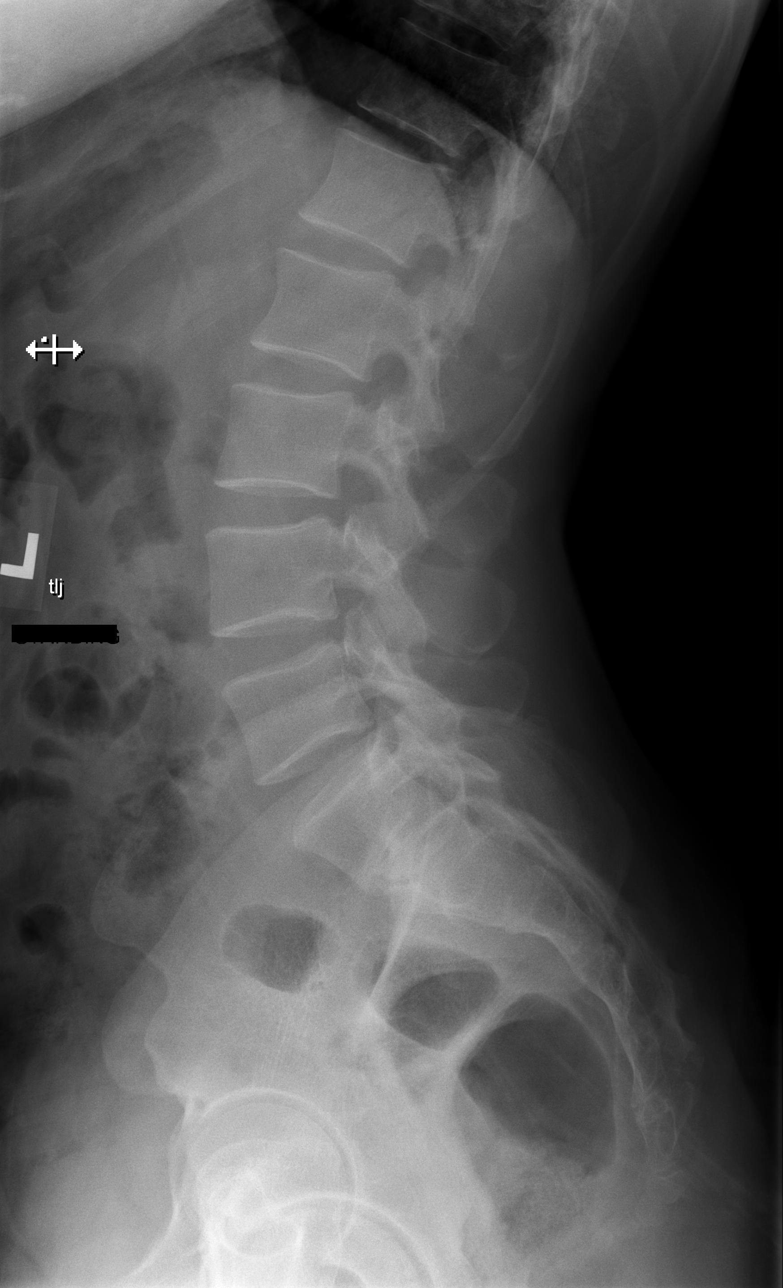

[w lumbar l-5 s-1 spot]
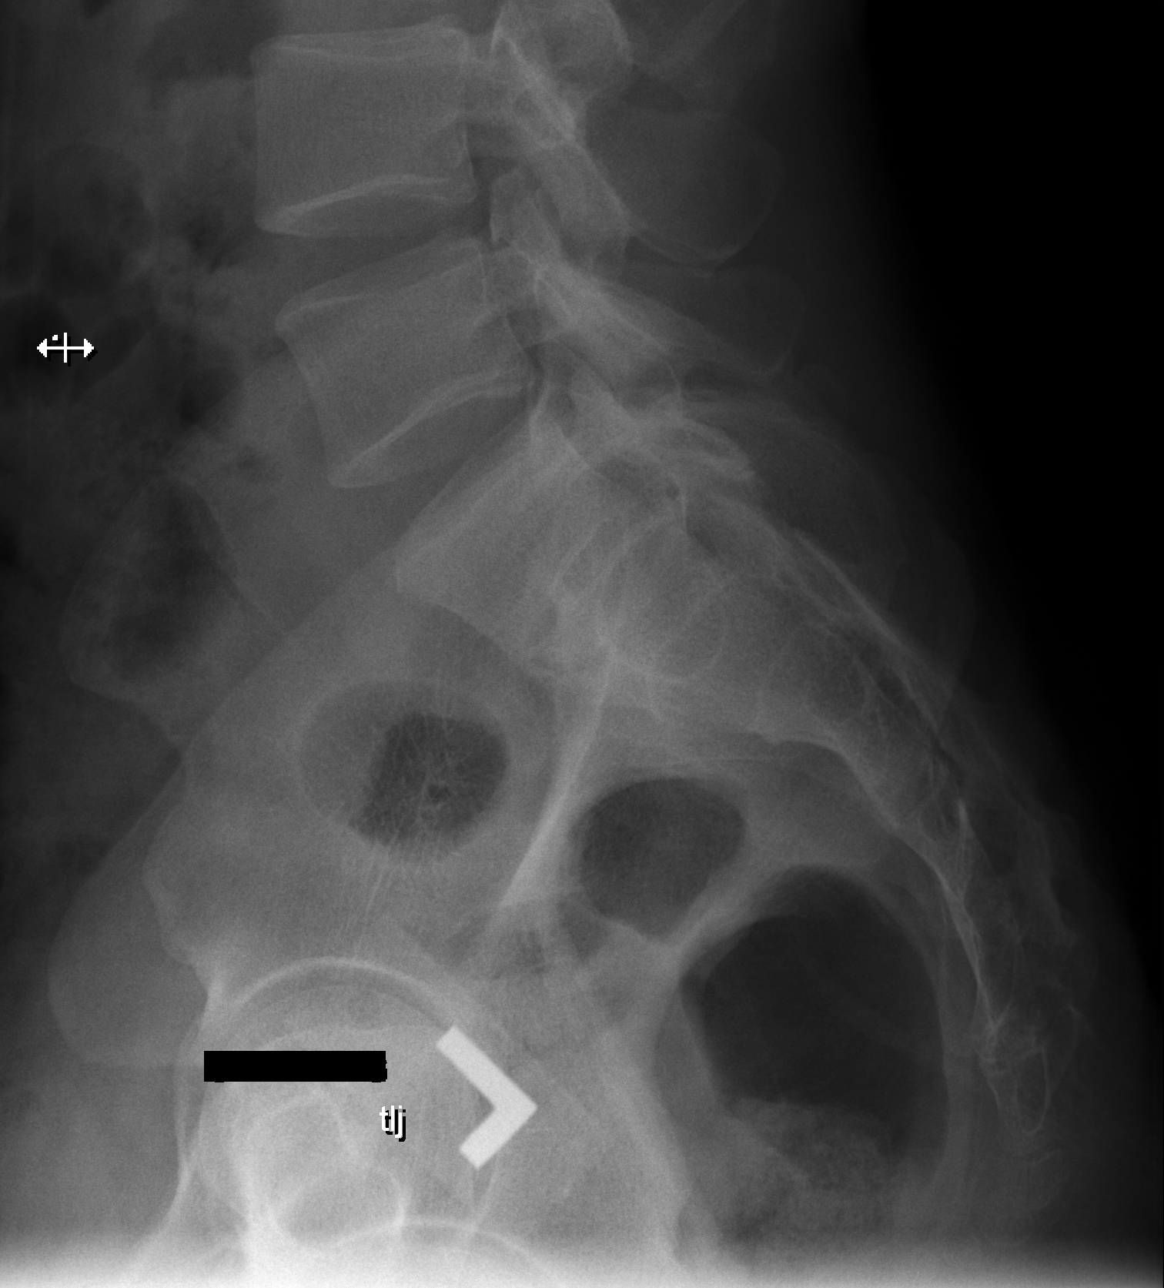

[5 of 5 positions shown; findings below may reference images not displayed]

FINDINGS: Transitional anatomy of S1 with a right assimilation joint. Mild
curvature of the lumbar spine, apex to the left. No fracture
malalignment. No degenerative changes.
IMPRESSION: Transitional anatomy at S1 with a right assimilation joint. No other
abnormalities.

## 2022-08-09 ENCOUNTER — Ambulatory Visit: Payer: Medicaid Other | Admitting: Student

## 2022-08-09 ENCOUNTER — Encounter: Payer: Self-pay | Admitting: Student

## 2022-08-09 VITALS — BP 147/97 | HR 92 | Ht 76.0 in | Wt 193.5 lb

## 2022-08-09 DIAGNOSIS — M62838 Other muscle spasm: Secondary | ICD-10-CM | POA: Diagnosis not present

## 2022-08-09 DIAGNOSIS — M549 Dorsalgia, unspecified: Secondary | ICD-10-CM

## 2022-08-09 DIAGNOSIS — R0789 Other chest pain: Secondary | ICD-10-CM | POA: Diagnosis not present

## 2022-08-09 DIAGNOSIS — G8929 Other chronic pain: Secondary | ICD-10-CM

## 2022-08-09 MED ORDER — MELOXICAM 15 MG PO TABS: ORAL_TABLET | ORAL | 0 refills | Status: DC

## 2022-08-09 NOTE — Assessment & Plan Note (Signed)
HPI and exam consistent with costochondral joint pain.  Discussed natural course of costochondritis, as symptoms may linger between 6-12 months.  Demonstrated stretches. - Mobic 15 mg daily 7 days, then as needed - Continue with stretches - Follow-up in approximately 1 month if not improved

## 2022-08-09 NOTE — Patient Instructions (Addendum)
It was great to see you! Thank you for allowing me to participate in your care!   I recommend that you always bring your medications to each appointment as this makes it easy to ensure we are on the correct medications and helps [place] not miss when refills are needed.  Our plans for today:  - Take 15 mg of Mobic for 7 days, then as needed - Continue to stretch - Follow-up in 1 month if not improving  Take care and seek immediate care sooner if you develop any concerns. Please remember to show up 15 minutes before your scheduled appointment time!  Tiffany Kocher, DO [facility]

## 2022-08-09 NOTE — Progress Notes (Deleted)
    SUBJECTIVE:   CHIEF COMPLAINT / HPI:   ***  PERTINENT  PMH / PSH: ***  OBJECTIVE:   BP (!) 151/98   Pulse 92   Ht 6\' 4"  (1.93 m)   Wt 193 lb 8 oz (87.8 kg)   BMI 23.55 kg/m   ***  ASSESSMENT/PLAN:   No problem-specific Assessment & Plan notes found for this encounter.     Tiffany Kocher, DO [facility]

## 2022-08-09 NOTE — Progress Notes (Signed)
    SUBJECTIVE:   CHIEF COMPLAINT / HPI:   Rib pain Patient has been experiencing intermittent rib pain as a far back as November, for which he received chest x-ray which was normal.  Chest pain is primarily on the ribs, near the costochondral joints and worse with exercise.  Previously been treated with Mobic which improved his pain, however pain has returned.  No alleviating factors, has not been taking medication. Sometimes the pain wakes him from sleep.  He denies fevers, cough, weight loss, shortness of breath, nausea vomiting.  He has been worked up for atypical chest pain in the past with echocardiogram in 2022 which was WNL.  OBJECTIVE:   BP (!) 147/97   Pulse 92   Ht 6\' 4"  (1.93 m)   Wt 193 lb 8 oz (87.8 kg)   BMI 23.55 kg/m    General: NAD, pleasant Cardio: RRR, no MRG. Cap Refill <2s. Respiratory: CTAB, normal wob on RA GI: Abdomen is soft, not tender, not distended. BS present MSK: Point tenderness on costochondral joints bilaterally, consistent with costochondritis. Skin: Warm and dry  ASSESSMENT/PLAN:   Costochondral chest pain HPI and exam consistent with costochondral joint pain.  Discussed natural course of costochondritis, as symptoms may linger between 6-12 months.  Demonstrated stretches. - Mobic 15 mg daily 7 days, then as needed - Continue with stretches - Follow-up in approximately 1 month if not improved   Follow-up recommendations: At the end of the visit after AVS was delivered patient endorsed intermittent crushing chest pain with exertion. Not currently experiencing this pain. Typically has increased mucus and SOB when this occurs, improves with rest. Quit smoking 7 months ago. Differential includes: stable angina, COPD/Asthma. Recommend PFT and cardiac stress testing. Appointment with PCP scheduled in room.  Tiffany Kocher, DO [facility]

## 2022-08-21 ENCOUNTER — Ambulatory Visit (AMBULATORY_SURGERY_CENTER): Payer: Medicaid Other | Admitting: Internal Medicine

## 2022-08-21 ENCOUNTER — Encounter: Payer: Self-pay | Admitting: Internal Medicine

## 2022-08-21 VITALS — BP 111/74 | HR 69 | Temp 98.1°F | Resp 17 | Ht 76.0 in | Wt 182.0 lb

## 2022-08-21 DIAGNOSIS — K219 Gastro-esophageal reflux disease without esophagitis: Secondary | ICD-10-CM

## 2022-08-21 DIAGNOSIS — K317 Polyp of stomach and duodenum: Secondary | ICD-10-CM

## 2022-08-21 DIAGNOSIS — I10 Essential (primary) hypertension: Secondary | ICD-10-CM | POA: Diagnosis not present

## 2022-08-21 DIAGNOSIS — K208 Other esophagitis without bleeding: Secondary | ICD-10-CM

## 2022-08-21 MED ORDER — SODIUM CHLORIDE 0.9 % IV SOLN: 500.0000 mL | Freq: Once | INTRAVENOUS | Status: DC

## 2022-08-21 NOTE — Progress Notes (Signed)
Sedate, gd SR, tolerated procedure well, VSS, report to RN 

## 2022-08-21 NOTE — Patient Instructions (Addendum)
Recommendation: - Discharge patient to home (with escort).                           - Await pathology results.                           - Esophagitis has improved.                           - Recommend continuing pantoprazole twice daily for                            the next 2 months, then can decrease to once daily.                           - Follow up in GI clinic in 4 months.                           - The findings and recommendations were discussed                            with the patient.  YOU HAD AN ENDOSCOPIC PROCEDURE TODAY AT [facility]:   Refer to the procedure report that was given to you for any specific questions about what was found during the examination.  If the procedure report does not answer your questions, please call your gastroenterologist to clarify.  If you requested that your care partner not be given the details of your procedure findings, then the procedure report has been included in a sealed envelope for you to review at your convenience later.  YOU SHOULD EXPECT: Some feelings of bloating in the abdomen. Passage of more gas than usual.  Walking can help get rid of the air that was put into your GI tract during the procedure and reduce the bloating. If you had a lower endoscopy (such as a colonoscopy or flexible sigmoidoscopy) you may notice spotting of blood in your stool or on the toilet paper. If you underwent a bowel prep for your procedure, you may not have a normal bowel movement for a few days.  Please Note:  You might notice some irritation and congestion in your nose or some drainage.  This is from the oxygen used during your procedure.  There is no need for concern and it should clear up in a day or so.  SYMPTOMS TO REPORT IMMEDIATELY:  Following upper endoscopy (EGD)  Vomiting of blood or coffee ground material  New chest pain or pain under the shoulder blades  Painful or persistently difficult swallowing  New shortness of  breath  Fever of 100F or higher  Black, tarry-looking stools  For urgent or emergent issues, a gastroenterologist can be reached at any hour by calling [phone]. Do not use MyChart messaging for urgent concerns.    DIET:  We do recommend a small meal at first, but then you may proceed to your regular diet.  Drink plenty of fluids but you should avoid alcoholic beverages for 24 hours.  ACTIVITY:  You should plan to take it easy for the rest of today and you should NOT DRIVE or use heavy machinery until tomorrow (because of the sedation medicines used during  the test).    FOLLOW UP: Our staff will call the number listed on your records the next business day following your procedure.  We will call around 7:15- 8:00 am to check on you and address any questions or concerns that you may have regarding the information given to you following your procedure. If we do not reach you, we will leave a message.     If any biopsies were taken you will be contacted by phone or by letter within the next 1-3 weeks.  Please call us at [phone] if you have not heard about the biopsies in 3 weeks.    SIGNATURES/CONFIDENTIALITY: You and/or your care partner have signed paperwork which will be entered into your electronic medical record.  These signatures attest to the fact that that the information above on your After Visit Summary has been reviewed and is understood.  Full responsibility of the confidentiality of this discharge information lies with you and/or your care-partner.

## 2022-08-21 NOTE — Progress Notes (Signed)
GASTROENTEROLOGY PROCEDURE H&P NOTE   Primary Care Physician: Evette Georges, MD    Reason for Procedure:   History of grade C esophagitis  Plan:    EGD  Patient is appropriate for endoscopic procedure(s) in the ambulatory ([facility]) setting.  The nature of the procedure, as well as the risks, benefits, and alternatives were carefully and thoroughly reviewed with the patient. Ample time for discussion and questions allowed. The patient understood, was satisfied, and agreed to proceed.     HPI: Shawn Stark is a 37 y.o. male who presents for EGD for evaluation of history of grade C esophagitis.  Patient states that he has been compliant with his pantoprazole therapy. He has been feeling a lot better since his last visit here for his EGD.  Past Medical History:  Diagnosis Date   GERD (gastroesophageal reflux disease)    Hypertension     Past Surgical History:  Procedure Laterality Date   UPPER GASTROINTESTINAL ENDOSCOPY      Prior to Admission medications   Medication Sig Start Date End Date Taking? Authorizing Provider  amLODipine (NORVASC) 5 MG tablet Take 1 tablet (5 mg total) by mouth at bedtime. 06/28/22  Yes Lilland, Alana, DO  pantoprazole (PROTONIX) 40 MG tablet Take 1 tablet (40 mg total) by mouth 2 (two) times daily. 06/12/22  Yes Imogene Burn, MD  meloxicam (MOBIC) 15 MG tablet Take once daily for 5 days.  After that, use only as needed.  Use very sparingly. 08/09/22   Tiffany Kocher, DO  Wheat Dextrin (BENEFIBER) POWD Take 1 Scoop by mouth daily. Patient not taking: Reported on 05/09/2022 10/04/20   Imogene Burn, MD    Current Outpatient Medications  Medication Sig Dispense Refill   amLODipine (NORVASC) 5 MG tablet Take 1 tablet (5 mg total) by mouth at bedtime. 90 tablet 1   pantoprazole (PROTONIX) 40 MG tablet Take 1 tablet (40 mg total) by mouth 2 (two) times daily. 90 tablet 3   meloxicam (MOBIC) 15 MG tablet Take once daily for 5 days.  After that, use only as  needed.  Use very sparingly. 30 tablet 0   Wheat Dextrin (BENEFIBER) POWD Take 1 Scoop by mouth daily. (Patient not taking: Reported on 05/09/2022)  0   Current Facility-Administered Medications  Medication Dose Route Frequency Provider Last Rate Last Admin   0.9 %  sodium chloride infusion  500 mL Intravenous Once Imogene Burn, MD       0.9 %  sodium chloride infusion  500 mL Intravenous Continuous Imogene Burn, MD       0.9 %  sodium chloride infusion  500 mL Intravenous Once Imogene Burn, MD        Allergies as of 08/21/2022   (No Known Allergies)    Family History  Problem Relation Age of Onset   Healthy Mother    Heart disease Mother    Healthy Father    Liver disease Neg Hx    Esophageal cancer Neg Hx    Colon cancer Neg Hx    Rectal cancer Neg Hx    Stomach cancer Neg Hx     Social History   Socioeconomic History   Marital status: Married    Spouse name: Not on file   Number of children: Not on file   Years of education: Not on file   Highest education level: Not on file  Occupational History   Occupation: sales  Tobacco Use   Smoking status: Former  Types: Cigarettes    Passive exposure: Past   Smokeless tobacco: Never  Vaping Use   Vaping status: Never Used  Substance and Sexual Activity   Alcohol use: Never   Drug use: Never   Sexual activity: Not on file  Other Topics Concern   Not on file  Social History Narrative   Not on file   Social Determinants of Health   Financial Resource Strain: Not on file  Food Insecurity: Not on file  Transportation Needs: Not on file  Physical Activity: Not on file  Stress: Not on file  Social Connections: Not on file  Intimate Partner Violence: Not on file    Physical Exam: Vital signs in last 24 hours: BP 128/87   Pulse 74   Temp 98.1 F (36.7 C)   Ht 6\' 4"  (1.93 m)   Wt 182 lb (82.6 kg)   SpO2 99%   BMI 22.15 kg/m  GEN: NAD EYE: Sclerae anicteric ENT: MMM CV: Non-tachycardic Pulm: No  increased WOB GI: Soft NEURO:  Alert & Oriented   Eulah Pont, MD [facility]   08/21/2022 8:37 AM

## 2022-08-21 NOTE — Op Note (Addendum)
[facility] Patient Name: Shawn Stark Procedure Date: 08/21/2022 8:44 AM MRN: [id number] Endoscopist: Madelyn Brunner [place] , , [id number] Age: 37 Referring MD:  Date of Birth: 1985/12/13 Gender: Male Account #: [id number] Procedure:                Upper GI endoscopy Indications:              Follow-up of esophagitis Medicines:                Monitored Anesthesia Care Procedure:                Pre-Anesthesia Assessment:                           - Prior to the procedure, a History and Physical                            was performed, and patient medications and                            allergies were reviewed. The patient's tolerance of                            previous anesthesia was also reviewed. The risks                            and benefits of the procedure and the sedation                            options and risks were discussed with the patient.                            All questions were answered, and informed consent                            was obtained. Prior Anticoagulants: The patient has                            taken no anticoagulant or antiplatelet agents. ASA                            Grade Assessment: II - A patient with mild systemic                            disease. After reviewing the risks and benefits,                            the patient was deemed in satisfactory condition to                            undergo the procedure.                           After obtaining informed consent, the endoscope was  passed under direct vision. Throughout the                            procedure, the patient's blood pressure, pulse, and                            oxygen saturations were monitored continuously. The                            Olympus Scope [id number] was introduced through the                            mouth, and advanced to the second part of duodenum.                            The upper GI endoscopy  was accomplished without                            difficulty. The patient tolerated the procedure                            well. Scope In: Scope Out: Findings:                 The examined esophagus was normal.                           The entire examined stomach was normal.                           A single 5 mm sessile polyp with no bleeding was                            found in the duodenal bulb. The polyp was removed                            with a cold snare. Resection and retrieval were                            complete. Complications:            No immediate complications. Estimated Blood Loss:     Estimated blood loss was minimal. Impression:               - Normal esophagus.                           - Normal stomach.                           - A single duodenal polyp. Resected and retrieved. Recommendation:           - Discharge patient to home (with escort).                           - Await pathology results.                           -  Esophagitis has improved.                           - Recommend continuing pantoprazole twice daily for                            the next 2 months, then can decrease to once daily.                           - Follow up in GI clinic in 4 months.                           - The findings and recommendations were discussed                            with the patient. Dr Particia Lather "[address]" [place],  08/21/2022 8:58:02 AM

## 2022-08-21 NOTE — Progress Notes (Signed)
Interpreter used today at the [facility] for this pt.  Interpreter's name is-Elzehour-Elmahadi

## 2022-08-21 NOTE — Progress Notes (Signed)
Called to room to assist during endoscopic procedure.  Patient ID and intended procedure confirmed with present staff. Received instructions for my participation in the procedure from the performing physician.  

## 2022-08-22 ENCOUNTER — Telehealth: Payer: Self-pay

## 2022-08-22 NOTE — Telephone Encounter (Signed)
  Follow up Call-     08/21/2022    8:02 AM 06/12/2022    2:05 PM 10/19/2020    7:25 AM  Call back number  Post procedure Call Back phone  # [phone]  Permission to leave phone message No Yes Yes  comments HIPPA sign placed on bed, recovery notified       Called patient but no answer, per instructions, no message left

## 2022-08-23 ENCOUNTER — Encounter: Payer: Self-pay | Admitting: Internal Medicine

## 2022-08-24 ENCOUNTER — Ambulatory Visit: Payer: Medicaid Other | Admitting: Family Medicine

## 2022-08-24 ENCOUNTER — Encounter: Payer: Self-pay | Admitting: Family Medicine

## 2022-08-24 ENCOUNTER — Ambulatory Visit (HOSPITAL_COMMUNITY)
Admission: RE | Admit: 2022-08-24 | Discharge: 2022-08-24 | Disposition: A | Discharge status: Home / Self Care | Payer: Medicaid Other | Source: Ambulatory Visit | Attending: Family Medicine | Admitting: Family Medicine

## 2022-08-24 VITALS — BP 128/81 | HR 84 | Wt 195.0 lb

## 2022-08-24 DIAGNOSIS — R0789 Other chest pain: Secondary | ICD-10-CM | POA: Insufficient documentation

## 2022-08-24 DIAGNOSIS — I10 Essential (primary) hypertension: Secondary | ICD-10-CM | POA: Diagnosis not present

## 2022-08-24 DIAGNOSIS — K2100 Gastro-esophageal reflux disease with esophagitis, without bleeding: Secondary | ICD-10-CM

## 2022-08-24 MED ORDER — DICLOFENAC SODIUM 1 % EX GEL
2.0000 g | Freq: Two times a day (BID) | CUTANEOUS | 0 refills | Status: DC
Start: 2022-08-24 — End: 2023-06-13

## 2022-08-24 MED ORDER — ADVOCATE HEATING PAD PADS
1.0000 | MEDICATED_PAD | 0 refills | Status: DC | PRN
Start: 2022-08-24 — End: 2023-12-20

## 2022-08-24 NOTE — Assessment & Plan Note (Addendum)
History and exam most likely costochondritis/chest wall pain syndrome. EKG obtained today reassuring and similar to prior in 2022. Recommended voltaren gel to the area BID and heat compresses to help with pain. Also recommended tylenol. Discussed pain could last up to 6-12 months, which is consistent with similar pain (and normal CXR) in 11/2021. Follow up in 1 month or go to ED if symptoms worsen before then.

## 2022-08-24 NOTE — Assessment & Plan Note (Signed)
Controlled today on amlodipine 5 mg daily. Continue regimen.

## 2022-08-24 NOTE — Assessment & Plan Note (Signed)
Symptoms improved with PPI use. EGD with improving esophagitis. Continue PPI for 2 months per GI and follow up with them in 4 months.

## 2022-08-24 NOTE — Patient Instructions (Addendum)
??? ?? ?????? ????? ?????! ???? ?? ?????? ???:    1. ????? ??? ???? ??????? ??????? ??????. ????? ???????? ???????? ?? ??? ???? ????? ?? ?????. ????? ????? ??????? ???????? 500 ??? 2 ??? ?? 6 ????? ?????? ??????? ?????.  ???? ?????? ??? ??? ???? ?? ????? ????.  ????? ????   It was great to see you today! Here's what we talked about:  I think you have costochondritis. I recommend voltaren gel to use on your chest twice a day. You can also use tylenol 500 mg 2 tablets every 6 hours and heating pads for the pain.  Please let me know if you have any other questions.  Dr. Phineas Real

## 2022-08-24 NOTE — Progress Notes (Unsigned)
    SUBJECTIVE:   CHIEF COMPLAINT / HPI:   Chest and abdominal pain follow up Still has pain. Recently had EGD which showed improving esophagitis. They recommended taking pantoprazole BID for next 2 months with GI follow up in 4 months. Has been taking PPI for the last 2 months with improvement in epigastric pain, but he is still having chest/rib pain intermittently (most recently nearly daily for the last 2 weeks, but it got better before). The pain is like a pressure on the chest, and this pain moves around the chest. Pain comes mostly in the mornings when he starts to move around. It starts to get better throughout the day, but when he bends forward, the pain comes back. No SOB with the pain. He took Mobic after last visit which helped the pain, but he has not been using it daily. Has also felt more hot sometimes and wonders if this is because of the pain.  PERTINENT  PMH / PSH: GERD, HTN, latent TB, costochondritis, tobacco use (stopped 7 months ago but smoked for 10 years at 15 cigarettes/day)  OBJECTIVE:   BP 128/81 (BP Location: Right Arm, Patient Position: Sitting, Cuff Size: Normal)   Pulse 84   Wt 195 lb (88.5 kg)   SpO2 98%   BMI 23.74 kg/m   General: Alert and oriented, in NAD Skin: Warm, dry, and intact without lesions HEENT: NCAT, EOM grossly normal, midline nasal septum Cardiac/Chest: RRR, no m/r/g appreciated, reproducible pain to palpation of L inferior chest wall, pain also reproducible to twisting of trunk left and right Respiratory: CTAB, breathing and speaking comfortably on RA Abdominal: Soft, nontender, nondistended, normoactive bowel sounds Extremities: Moves all extremities grossly equally Neurological: No gross focal deficit Psychiatric: Appropriate mood and affect   ASSESSMENT/PLAN:   Costochondral chest pain History and exam most likely costochondritis/chest wall pain syndrome. EKG obtained today reassuring and similar to prior in 2022. Recommended voltaren  gel to the area BID and heat compresses to help with pain. Also recommended tylenol. Discussed pain could last up to 6-12 months, which is consistent with similar pain (and normal CXR) in 11/2021. Follow up in 1 month or go to ED if symptoms worsen before then.  GERD (gastroesophageal reflux disease) Symptoms improved with PPI use. EGD with improving esophagitis. Continue PPI for 2 months per GI and follow up with them in 4 months.  Primary hypertension Controlled today on amlodipine 5 mg daily. Continue regimen.   Janeal Holmes, MD [facility]

## 2022-09-24 ENCOUNTER — Ambulatory Visit: Payer: Self-pay | Admitting: Family Medicine

## 2022-10-22 ENCOUNTER — Ambulatory Visit: Payer: Medicaid Other | Admitting: Family Medicine

## 2022-10-22 NOTE — Progress Notes (Deleted)
   SUBJECTIVE:   CHIEF COMPLAINT / HPI:  Shawn Stark is a 36 y.o. male with a pertinent past medical history of *** presenting to the clinic for   Costochondral chest pain 7/26 History and exam most likely costochondritis/chest wall pain syndrome. EKG obtained today reassuring and similar to prior in 2022. Recommended voltaren gel to the area BID and heat compresses to help with pain. Also recommended tylenol. Discussed pain could last up to 6-12 months, which is consistent with similar pain (and normal CXR) in 11/2021. Follow up in 1 month or go to ED if symptoms worsen before then.   PERTINENT PMH / PSH: ***   OBJECTIVE:   There were no vitals taken for this visit. Physical Exam General: Age-appropriate, resting comfortably in chair, NAD, alert and at baseline. HEENT:  Head: Normocephalic, atraumatic. No tenderness to percussion over sinuses. Eyes: PERRLA. No conjunctival erythema or scleral injections. Ears: TMs non-bulging and non-erythematous bilaterally. No erythema of external ear canal. No cerumen impaction. Nose: Non-erythematous turbinates. No rhinorrhea. Mouth/Oral: Clear, no tonsillar exudate. MMM. Neck: Supple. No LAD. Cardiovascular: Regular rate and rhythm. Normal S1/S2. No murmurs, rubs, or gallops appreciated. 2+ radial pulses. Pulmonary: Clear bilaterally to ascultation. No increased WOB, no accessory muscle usage. No wheezes, crackles, or rhonchi. Abdominal: No tenderness to deep or light palpation. No rebound or guarding. No HSM. Skin: Warm and dry. Extremities: No peripheral edema bilaterally. Capillary refill <2 seconds.  No results found for this or any previous visit (from the past 48 hour(s)).     06/28/2022    8:23 AM  Depression screen PHQ 2/9  Decreased Interest 0  Down, Depressed, Hopeless 0  PHQ - 2 Score 0  Altered sleeping 0  Tired, decreased energy 0  Change in appetite 0  Feeling bad or failure about yourself  0  Trouble concentrating 0   Moving slowly or fidgety/restless 0  Suicidal thoughts 0  PHQ-9 Score 0     ASSESSMENT/PLAN:   No problem-specific Assessment & Plan notes found for this encounter.   No follow-ups on file.  Shawn Stark Sharion Dove, MD [facility]

## 2022-12-20 ENCOUNTER — Ambulatory Visit: Payer: Medicaid Other | Admitting: Internal Medicine

## 2022-12-20 ENCOUNTER — Encounter: Payer: Self-pay | Admitting: Internal Medicine

## 2022-12-20 ENCOUNTER — Other Ambulatory Visit: Payer: Medicaid Other

## 2022-12-20 VITALS — BP 118/80 | HR 79 | Ht 69.0 in | Wt 195.0 lb

## 2022-12-20 DIAGNOSIS — R1013 Epigastric pain: Secondary | ICD-10-CM | POA: Diagnosis not present

## 2022-12-20 DIAGNOSIS — Z8719 Personal history of other diseases of the digestive system: Secondary | ICD-10-CM

## 2022-12-20 DIAGNOSIS — K219 Gastro-esophageal reflux disease without esophagitis: Secondary | ICD-10-CM

## 2022-12-20 DIAGNOSIS — R0789 Other chest pain: Secondary | ICD-10-CM | POA: Diagnosis not present

## 2022-12-20 DIAGNOSIS — K2100 Gastro-esophageal reflux disease with esophagitis, without bleeding: Secondary | ICD-10-CM

## 2022-12-20 DIAGNOSIS — R042 Hemoptysis: Secondary | ICD-10-CM | POA: Diagnosis not present

## 2022-12-20 LAB — HEPATIC FUNCTION PANEL
ALT: 26 U/L (ref 0–53)
AST: 21 U/L (ref 0–37)
Albumin: 4.3 g/dL (ref 3.5–5.2)
Alkaline Phosphatase: 58 U/L (ref 39–117)
Bilirubin, Direct: 0.1 mg/dL (ref 0.0–0.3)
Total Bilirubin: 1.1 mg/dL (ref 0.2–1.2)
Total Protein: 7.1 g/dL (ref 6.0–8.3)

## 2022-12-20 LAB — CBC WITH DIFFERENTIAL/PLATELET
Basophils Absolute: 0 10*3/uL (ref 0.0–0.1)
Basophils Relative: 0.6 % (ref 0.0–3.0)
Eosinophils Absolute: 0.4 10*3/uL (ref 0.0–0.7)
Eosinophils Relative: 9.6 % — ABNORMAL HIGH (ref 0.0–5.0)
HCT: 47.4 % (ref 39.0–52.0)
Hemoglobin: 16.6 g/dL (ref 13.0–17.0)
Lymphocytes Relative: 46.8 % — ABNORMAL HIGH (ref 12.0–46.0)
Lymphs Abs: 1.8 10*3/uL (ref 0.7–4.0)
MCHC: 35 g/dL (ref 30.0–36.0)
MCV: 85.2 fL (ref 78.0–100.0)
Monocytes Absolute: 0.4 10*3/uL (ref 0.1–1.0)
Monocytes Relative: 10.4 % (ref 3.0–12.0)
Neutro Abs: 1.2 10*3/uL — ABNORMAL LOW (ref 1.4–7.7)
Neutrophils Relative %: 32.6 % — ABNORMAL LOW (ref 43.0–77.0)
Platelets: 173 10*3/uL (ref 150.0–400.0)
RBC: 5.57 Mil/uL (ref 4.22–5.81)
RDW: 13.3 % (ref 11.5–15.5)
WBC: 3.7 10*3/uL — ABNORMAL LOW (ref 4.0–10.5)

## 2022-12-20 MED ORDER — PANTOPRAZOLE SODIUM 40 MG PO TBEC: 40.0000 mg | DELAYED_RELEASE_TABLET | Freq: Two times a day (BID) | ORAL | 3 refills | Status: DC

## 2022-12-20 NOTE — Patient Instructions (Addendum)
Your provider has requested that you go to the basement level for lab work before leaving today. Press "B" on the elevator. The lab is located at the first door on the left as you exit the elevator.  ??????????? 40 ??? ???? ????? ??? ???? ????? ?????. bantubrazul 40 malagh yurjaa tanawul qurs wahid maratayn yawmia.  ???????? ???? 3 ???? almutabaeat khilal 3 'ashhur  If your blood pressure at your visit was 140/90 or greater, please contact your primary care physician to follow up on this.  _______________________________________________________  If you are age 88 or older, your body mass index should be between 23-30. Your Body mass index is 28.8 kg/m. If this is out of the aforementioned range listed, please consider follow up with your Primary Care Provider.  If you are age 94 or younger, your body mass index should be between 19-25. Your Body mass index is 28.8 kg/m. If this is out of the aformentioned range listed, please consider follow up with your Primary Care Provider.   ________________________________________________________  The [facility] providers would like to encourage you to use [facility] to communicate with providers for non-urgent requests or questions.  Due to long hold times on the telephone, sending your provider a message by [facility] may be a faster and more efficient way to get a response.  Please allow 48 business hours for a response.  Please remember that this is for non-urgent requests.  _______________________________________________________   Due to recent changes in healthcare laws, you may see the results of your imaging and laboratory studies on MyChart before your provider has had a chance to review them.  We understand that in some cases there may be results that are confusing or concerning to you. Not all laboratory results come back in the same time frame and the provider may be waiting for multiple results in order to interpret others.  Please give [place] 48 hours  in order for your provider to thoroughly review all the results before contacting the office for clarification of your results.   Thank you for entrusting me with your care and for choosing [facility], Dr. Eulah Pont

## 2022-12-20 NOTE — Progress Notes (Signed)
Chief Complaint: Epigastric ab pain  HPI : 37 year old male with history of GERD and prior esophagitis presents for follow up of epigastric ab pain  Interval History: Over the last few days his epigastric ab pain/chest discomfort has gotten worse. Endorses some spitting up blood.  He is currently taking his pantoprazole once daily.  Patient states that when he was on twice daily pantoprazole, he had resolution in his abdominal pain/chest discomfort.  Stools are black with sticky and tarry features. He uses ibuprofen once a month. His bowel habits are good. He now has 3 BMs per day at times. Denies heavy alcohol use currently or in the past.   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   Past Surgical History:  Procedure Laterality Date   UPPER GASTROINTESTINAL ENDOSCOPY     Family History  Problem Relation Age of Onset   Healthy Mother    Heart disease Mother    Healthy Father    Liver disease Neg Hx    Esophageal cancer Neg Hx    Colon cancer Neg Hx    Rectal cancer Neg Hx    Stomach cancer Neg Hx    Social History   Tobacco Use   Smoking status: Former    Types: Cigarettes    Passive exposure: Past   Smokeless tobacco: Never  Vaping Use   Vaping status: Never Used  Substance Use Topics   Alcohol use: Never   Drug use: Never   Current Outpatient Medications  Medication Sig Dispense Refill   amLODipine (NORVASC) 5 MG tablet Take 1 tablet (5 mg total) by mouth at bedtime. 90 tablet 1   diclofenac Sodium (VOLTAREN) 1 % GEL Apply 2 g topically 2 (two) times daily. 50 g 0   Heating Pads (ADVOCATE HEATING PAD) PADS 1 each by Does not apply  route as needed. 1 each 0   pantoprazole (PROTONIX) 40 MG tablet Take 1 tablet (40 mg total) by mouth 2 (two) times daily. 90 tablet 3   Wheat Dextrin (BENEFIBER) POWD Take 1 Scoop by mouth daily.  0   Current Facility-Administered Medications  Medication Dose Route Frequency Provider Last Rate Last Admin   0.9 %  sodium chloride infusion  500 mL Intravenous Once Imogene Burn, MD       0.9 %  sodium chloride infusion  500 mL Intravenous Continuous Imogene Burn, MD       No Known Allergies   Physical Exam: BP 118/80   Pulse 79   Ht 5\' 9"  (1.753 m)   Wt 195 lb (88.5 kg)   BMI 28.80 kg/m  Constitutional: Pleasant,well-developed, male in no acute distress. HEENT: Normocephalic and atraumatic. Conjunctivae are normal. No scleral icterus. Cardiovascular: Normal rate Pulmonary/chest: Effort normal Abdominal: Soft, nondistended, tender in the epigastric area. Bowel sounds active throughout. There are no masses palpable. No hepatomegaly. Extremities: No edema Neurological: Alert and oriented to person place and time. Skin: Skin is warm and dry. No rashes noted. Psychiatric: Normal mood and affect. Behavior is normal.  Labs 07/2020: TSH nml  Labs 08/2020: Lipase nml. CMP unremarkable. CBC unremarkable.  Labs 12/2021: H pylori breath test negative.  Labs 03/2022: CBC with low plts of 120.   Labs 05/2022: BMP nml.   TTE 08/22/20: 1. Left ventricular ejection fraction, by estimation, is 60 to 65%. The left ventricle has normal function. The left ventricle has no regional wall motion abnormalities. Left ventricular diastolic parameters were  normal.   2. Right ventricular systolic function is normal. The right ventricular size is normal.   3. The mitral valve is grossly normal. Trivial mitral valve  regurgitation.   4. The aortic valve is tricuspid. Aortic valve regurgitation is not visualized.   RUQ U/S 09/20/20: IMPRESSION: Unremarkable RIGHT upper quadrant sonogram. No signs of  acute biliary process.  EGD 06/12/22: - LA Grade C reflux esophagitis with no bleeding. Biopsied. - Gastritis. Biopsied. - Normal examined duodenum. Path: 1. Surgical [P], gastric antrum and gastric body GASTRIC ANTRAL / OXYNTIC MUCOSA WITH FOCAL MILD CHRONIC INACTIVE GASTRITIS. NO H. PYLORI IDENTIFIED ON H&E. NEGATIVE FOR INTESTINAL METAPLASIA OR DYSPLASIA. 2. Surgical [P], esophagus ESOPHAGEAL SQUAMOUS MUCOSA WITH REACTIVE EPITHELIAL CHANGES AND RARE INTRAEPITHELIAL LYMPHOCYTES SUGGESTIVE OF MILD REFLUX DISEASE. NEGATIVE FOR DYSPLASIA OR MALIGNANCY.  EGD 08/21/22: - Normal esophagus. - Normal stomach. - A single duodenal polyp. Resected and retrieved. Path: Surgical [P], duodenal polyp, polyp (1) - POLYPOID FRAGMENT OF BENIGN SMALL BOWEL MUCOSA WITH FOVEOLAR METAPLASIA, SUGGESTIVE OF PEPTIC INJURY   ASSESSMENT AND PLAN: Epigastric ab pain/chest discomfort Spitting up of blood Melena GERD Constipation Thrombocytopenia Patient has noted some epigastric abdominal pain and lower chest discomfort, which had originally improved with pantoprazole twice daily therapy.  However after he transition to daily therapy, he has had a recurrence in his symptoms.  Many of his symptoms sound similar to what they were when I saw him in clinic previously.  He had grade C esophagitis seen on his EGD in the past.  Suspect that he likely has recurrent esophagitis with uncontrolled GERD at this time.  Will increase his pantoprazole back to twice daily to see if this helps with his symptoms.  - Previously gave GERD handout  - Check CBC, LFTs - Increase pantoprazole from QD to BID - Patient will let me know how he doing after 2 weeks to see if he is responding to PPI therapy - RTC in 3 months  Eulah Pont, MD  I spent 35 minutes of time, including in depth chart review, independent review of results as outlined above, communicating results with the patient directly, face-to-face time with the patient,  coordinating care, ordering studies and medications as appropriate, and documentation.

## 2023-01-15 ENCOUNTER — Ambulatory Visit (INDEPENDENT_AMBULATORY_CARE_PROVIDER_SITE_OTHER): Payer: Medicaid Other | Admitting: Family Medicine

## 2023-01-15 ENCOUNTER — Ambulatory Visit (HOSPITAL_COMMUNITY)
Admission: RE | Admit: 2023-01-15 | Discharge: 2023-01-15 | Disposition: A | Discharge status: Home / Self Care | Payer: Medicaid Other | Source: Ambulatory Visit | Attending: Family Medicine | Admitting: Family Medicine

## 2023-01-15 ENCOUNTER — Encounter: Payer: Self-pay | Admitting: Family Medicine

## 2023-01-15 VITALS — BP 141/97 | HR 83 | Ht 69.0 in | Wt 198.6 lb

## 2023-01-15 DIAGNOSIS — D72819 Decreased white blood cell count, unspecified: Secondary | ICD-10-CM | POA: Diagnosis not present

## 2023-01-15 DIAGNOSIS — R0789 Other chest pain: Secondary | ICD-10-CM

## 2023-01-15 DIAGNOSIS — I10 Essential (primary) hypertension: Secondary | ICD-10-CM | POA: Diagnosis not present

## 2023-01-15 DIAGNOSIS — R9431 Abnormal electrocardiogram [ECG] [EKG]: Secondary | ICD-10-CM | POA: Diagnosis not present

## 2023-01-15 DIAGNOSIS — I4519 Other right bundle-branch block: Secondary | ICD-10-CM | POA: Insufficient documentation

## 2023-01-15 NOTE — Progress Notes (Unsigned)
    SUBJECTIVE:   CHIEF COMPLAINT / HPI:   Chest pain Similar to prior.  Active and persistent today.  Hurts whenever he presses on it.  Has been using Voltaren gel which helps.  Has also been taking his Protonix twice a day which has also been helping.  Has history of esophagitis and follows with GI.  Low WBC WBC 3.7 on last check.  He would like to get this rechecked today, as he is a little worried.  PERTINENT  PMH / PSH: Hypertension, GERD and esophagitis on endoscopy, latent TB, history of tobacco use, costochondral chest pain  OBJECTIVE:   BP (!) 141/97   Pulse 83   Ht 5\' 9"  (1.753 m)   Wt 198 lb 9.6 oz (90.1 kg)   SpO2 99%   BMI 29.33 kg/m   General: Alert and oriented, in NAD Skin: Warm, dry, and intact HEENT: NCAT, EOM grossly normal, midline nasal septum Cardiac/Chest: RRR, tenderness to palpation along bilateral costochondral junctions Respiratory: Breathing and speaking comfortably on RA Extremities: Moves all extremities grossly equally Neurological: No gross focal deficit Psychiatric: Appropriate mood and affect   ASSESSMENT/PLAN:   Costochondral chest pain As evidenced by exam and negative EKG for cardiac abnormalities compared to prior.  Also likely some component of esophagitis/GERD given relief with PPI.  Advised to continue Voltaren gel on chest up to 4 times a day as well as twice daily dosing of PPI.  Follow-up as needed.  Leukopenia Very mild.  Has intermittently been slightly below for the past.  Consider recent viral infection versus benign ethnic neutropenia.  Exam reassuring today.  Will repeat CBC at patient's request.  Primary hypertension Mildly elevated today.  Unfortunately not rechecked.  Reassured by exam.  Continue amlodipine 5 mg daily and reassess at next visit.   Janeal Holmes, MD [facility]

## 2023-01-15 NOTE — Patient Instructions (Addendum)
????? ?? ????? ???? ????????? ????? ?? ????????? ?? ??????? ?? ???????? ??? ????? ????? ????? ????? ???? ??????? ???? ????? ???? ??? ???? ??????? ??? ????? ??? ????    Continue to take your Protonix Continue to use Voltaren gel on the chest We will repeat your white blood cell count today Go to the emergency room if your chest pain worsens

## 2023-01-16 ENCOUNTER — Telehealth: Payer: Self-pay | Admitting: Family Medicine

## 2023-01-16 DIAGNOSIS — D72819 Decreased white blood cell count, unspecified: Secondary | ICD-10-CM

## 2023-01-16 HISTORY — DX: Decreased white blood cell count, unspecified: D72.819

## 2023-01-16 LAB — CBC WITH DIFFERENTIAL/PLATELET
Basophils Absolute: 0 10*3/uL (ref 0.0–0.2)
Basos: 1 %
EOS (ABSOLUTE): 0.5 10*3/uL — ABNORMAL HIGH (ref 0.0–0.4)
Eos: 9 %
Hematocrit: 46.5 % (ref 37.5–51.0)
Hemoglobin: 15.3 g/dL (ref 13.0–17.7)
Immature Grans (Abs): 0 10*3/uL (ref 0.0–0.1)
Immature Granulocytes: 0 %
Lymphocytes Absolute: 2.3 10*3/uL (ref 0.7–3.1)
Lymphs: 45 %
MCH: 28.3 pg (ref 26.6–33.0)
MCHC: 32.9 g/dL (ref 31.5–35.7)
MCV: 86 fL (ref 79–97)
Monocytes Absolute: 0.6 10*3/uL (ref 0.1–0.9)
Monocytes: 11 %
Neutrophils Absolute: 1.7 10*3/uL (ref 1.4–7.0)
Neutrophils: 34 %
Platelets: 171 10*3/uL (ref 150–450)
RBC: 5.4 x10E6/uL (ref 4.14–5.80)
RDW: 13.1 % (ref 11.6–15.4)
WBC: 5.1 10*3/uL (ref 3.4–10.8)

## 2023-01-16 NOTE — Telephone Encounter (Signed)
Attempted to call patient concerning CBC results with Arabic interpreter; a voice message was left to call the clinic back at his convenience to chat more or make an appointment about these results.    His leukopenia has resolved.  He does have a persistent mild eosinophilia.  Absolute eosinophils is 0.5 and was zero 10 months ago.  In addition, his relative eosinophil percentage was 9.6% 3 weeks ago with a prior of 1% 2 years ago.  While his persistent chest pain is likely costochondritis/GERD, could consider intestinal parasitic infection if continues, especially given his prior evidence of esophagitis.  Less likely eosinophilic esophagitis or autoimmune condition given prior endoscopic findings, however.  No malignancy detected on recent endoscopy, either.  Consider stool or serologic testing for parasitic infection as indicated in the future.

## 2023-01-16 NOTE — Assessment & Plan Note (Signed)
As evidenced by exam and negative EKG for cardiac abnormalities compared to prior.  Also likely some component of esophagitis/GERD given relief with PPI.  Advised to continue Voltaren gel on chest up to 4 times a day as well as twice daily dosing of PPI.  Follow-up as needed.

## 2023-01-16 NOTE — Assessment & Plan Note (Signed)
Very mild.  Has intermittently been slightly below for the past.  Consider recent viral infection versus benign ethnic neutropenia.  Exam reassuring today.  Will repeat CBC at patient's request.

## 2023-01-16 NOTE — Assessment & Plan Note (Signed)
Mildly elevated today.  Unfortunately not rechecked.  Reassured by exam.  Continue amlodipine 5 mg daily and reassess at next visit.

## 2023-01-17 ENCOUNTER — Telehealth: Payer: Self-pay | Admitting: Family Medicine

## 2023-01-17 NOTE — Telephone Encounter (Signed)
Letter drafted and sent to admin team to be sent to patient informing of normalized white blood cell count and mildly elevated eosinophilia.  Advised to follow-up in 3 months for recheck or sooner if persistent chest/abdominal pain for further evaluation.

## 2023-02-21 ENCOUNTER — Encounter: Payer: Self-pay | Admitting: Internal Medicine

## 2023-03-12 ENCOUNTER — Encounter: Payer: Self-pay | Admitting: Family Medicine

## 2023-03-12 ENCOUNTER — Ambulatory Visit (INDEPENDENT_AMBULATORY_CARE_PROVIDER_SITE_OTHER): Payer: Medicaid Other | Admitting: Family Medicine

## 2023-03-12 VITALS — BP 122/80 | HR 78 | Ht 69.0 in | Wt 202.6 lb

## 2023-03-12 DIAGNOSIS — I10 Essential (primary) hypertension: Secondary | ICD-10-CM

## 2023-03-12 DIAGNOSIS — D721 Eosinophilia, unspecified: Secondary | ICD-10-CM | POA: Diagnosis not present

## 2023-03-12 DIAGNOSIS — G8929 Other chronic pain: Secondary | ICD-10-CM

## 2023-03-12 DIAGNOSIS — M549 Dorsalgia, unspecified: Secondary | ICD-10-CM | POA: Diagnosis not present

## 2023-03-12 DIAGNOSIS — K2100 Gastro-esophageal reflux disease with esophagitis, without bleeding: Secondary | ICD-10-CM

## 2023-03-12 DIAGNOSIS — R0789 Other chest pain: Secondary | ICD-10-CM | POA: Diagnosis not present

## 2023-03-12 MED ORDER — METHOCARBAMOL 500 MG PO TABS: 500.0000 mg | ORAL_TABLET | Freq: Three times a day (TID) | ORAL | 0 refills | Status: DC | PRN

## 2023-03-12 MED ORDER — PANTOPRAZOLE SODIUM 40 MG PO TBEC: 40.0000 mg | DELAYED_RELEASE_TABLET | Freq: Two times a day (BID) | ORAL | 3 refills | Status: DC

## 2023-03-12 MED ORDER — MELOXICAM 15 MG PO TABS: 15.0000 mg | ORAL_TABLET | Freq: Every day | ORAL | 0 refills | Status: AC

## 2023-03-12 NOTE — Patient Instructions (Addendum)
??? ????? ?????????? ????????? ??????????? ?? ???? ???? ?????.  ?????   1 ?????????? ?????? ???? 7 ????.  ????? ????? ????? Robaxin ?? 8 ????? ??? ?????? ?????? ?????.  ???? ??? ????? ????? ?? ???? ???? ???? ????.  ???? ?? ????? ???? ?????????? ??? ??? ?????? ???? ?? ????? ?? ????? ???? ?????.  ??? ???? ????? ???? ????? ???? ?????? ??????? ??????? ???. ??? ?????? ????? ?????? ????? ????????? ???? ??? ???? ????? ????? ????? ???????.  I have sent in meloxicam and Robaxin to use for back pain.  Take 1 meloxicam daily for 7 days.  You can also take Robaxin every 8 hours as needed for pain.  This will also help your chest wall pain.  Be sure to take your Protonix, as this medicine can help with the abdominal pain.  We also collected blood counts today, I will follow-up results with you. I have also given you back exercises to try, but if these are too painful, please stop them.

## 2023-03-12 NOTE — Progress Notes (Unsigned)
    SUBJECTIVE:   CHIEF COMPLAINT / HPI:   Acute on chronic back pain Has multiple year history of this. Has been treated with meloxicam, PT, and robaxin in the past. 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 Over the last 2 weeks, pain started in the lower back but has been getting worse and moving up to his neck. The pain feels like a "tearing." The pain is from the neck all the way down. The pain started after being active and lifting heavy objects. No numbness or tingling in the limbs. No weakness in the limbs. He states that he also had pain in his bilateral knees, and he feels it radiates down to this area from his lower back. No fevers. Has not been using any medications or heating pads for the pain.  No bowel or bladder incontinence.  Mild eosinophilia Last blood draw 1 month ago with absolute eosinophilia 0.5.  Previous absolute count was 0 one year ago.  Costochondral chest pain, GERD Continues. When he takes the protonix, his pain is improved. However, he only takes it once a day and is nearly out of it.  PERTINENT  PMH / PSH: Hypertension, tinea pedis, latent TB, leukopenia (now resolved), chest wall pain  OBJECTIVE:   BP 122/80   Pulse 78   Ht 5\' 9"  (1.753 m)   Wt 202 lb 9.6 oz (91.9 kg)   SpO2 98%   BMI 29.92 kg/m   General: Alert and oriented, in NAD Skin: Warm, dry, and intact without lesions HEENT: NCAT, EOM grossly normal, midline nasal septum Cardiac: RRR, no m/r/g appreciated Respiratory/Chest/Back: CTAB, breathing and speaking comfortably on RA, mild pain to palpation of entire chest wall, tenderness palpation over entire lower back though increased along the right paraspinal muscles Abdominal: Soft, nontender, nondistended, normoactive bowel sounds Extremities: Moves all extremities grossly equally Neurological: No gross focal deficit, strength and sensation intact throughout Psychiatric:  Appropriate mood and affect   ASSESSMENT/PLAN:   Primary hypertension Blood pressure at goal today without use of amlodipine.  Continue to monitor and restart as needed.  GERD (gastroesophageal reflux disease) Refilled Protonix.  Advised to take twice a day as prescribed.  Follow-up as needed.  Costochondral chest pain Continues today with reproducibility on exam.  Also likely component of GERD.  Continue Protonix.  Meloxicam and Robaxin for back pain as below will also help.  Follow-up as needed.  Chronic back pain Acute muscle strain today in the setting of heavy lifting recently.  Reassuringly without neurologic deficits on history exam.  Discussed taking meloxicam 15 mg daily for 7 days as well as Robaxin 500 mg every 8 hours as needed.  Continue heating pads.  Can consider physical therapy in the future if not improving.  Eosinophilia Mild eosinophilia noted on prior labs.  Repeated CBC today which is normal.  Janeal Holmes, MD [facility]

## 2023-03-13 ENCOUNTER — Encounter: Payer: Self-pay | Admitting: Family Medicine

## 2023-03-13 DIAGNOSIS — D721 Eosinophilia, unspecified: Secondary | ICD-10-CM | POA: Insufficient documentation

## 2023-03-13 HISTORY — DX: Eosinophilia, unspecified: D72.10

## 2023-03-13 LAB — CBC WITH DIFFERENTIAL/PLATELET
Basophils Absolute: 0 10*3/uL (ref 0.0–0.2)
Basos: 1 %
EOS (ABSOLUTE): 0.4 10*3/uL (ref 0.0–0.4)
Eos: 8 %
Hematocrit: 47 % (ref 37.5–51.0)
Hemoglobin: 15.8 g/dL (ref 13.0–17.7)
Immature Grans (Abs): 0 10*3/uL (ref 0.0–0.1)
Immature Granulocytes: 0 %
Lymphocytes Absolute: 2.2 10*3/uL (ref 0.7–3.1)
Lymphs: 45 %
MCH: 28.9 pg (ref 26.6–33.0)
MCHC: 33.6 g/dL (ref 31.5–35.7)
MCV: 86 fL (ref 79–97)
Monocytes Absolute: 0.5 10*3/uL (ref 0.1–0.9)
Monocytes: 10 %
Neutrophils Absolute: 1.8 10*3/uL (ref 1.4–7.0)
Neutrophils: 36 %
Platelets: 206 10*3/uL (ref 150–450)
RBC: 5.47 x10E6/uL (ref 4.14–5.80)
RDW: 13.2 % (ref 11.6–15.4)
WBC: 5 10*3/uL (ref 3.4–10.8)

## 2023-03-13 NOTE — Assessment & Plan Note (Signed)
Blood pressure at goal today without use of amlodipine.  Continue to monitor and restart as needed.

## 2023-03-13 NOTE — Assessment & Plan Note (Addendum)
Acute muscle strain today in the setting of heavy lifting recently.  Reassuringly without neurologic deficits on history exam.  Discussed taking meloxicam 15 mg daily for 7 days as well as Robaxin 500 mg every 8 hours as needed.  Continue heating pads.  Can consider physical therapy in the future if not improving.

## 2023-03-13 NOTE — Assessment & Plan Note (Signed)
Mild eosinophilia noted on prior labs.  Repeated CBC today which is normal.

## 2023-03-13 NOTE — Assessment & Plan Note (Signed)
Continues today with reproducibility on exam.  Also likely component of GERD.  Continue Protonix.  Meloxicam and Robaxin for back pain as below will also help.  Follow-up as needed.

## 2023-03-13 NOTE — Assessment & Plan Note (Signed)
Refilled Protonix.  Advised to take twice a day as prescribed.  Follow-up as needed.

## 2023-04-23 ENCOUNTER — Encounter: Payer: Self-pay | Admitting: Family Medicine

## 2023-04-23 ENCOUNTER — Ambulatory Visit: Admitting: Family Medicine

## 2023-04-23 ENCOUNTER — Other Ambulatory Visit (HOSPITAL_COMMUNITY): Payer: Self-pay

## 2023-04-23 VITALS — BP 142/106 | HR 75 | Ht 69.0 in | Wt 190.8 lb

## 2023-04-23 DIAGNOSIS — G8929 Other chronic pain: Secondary | ICD-10-CM

## 2023-04-23 DIAGNOSIS — M549 Dorsalgia, unspecified: Secondary | ICD-10-CM

## 2023-04-23 DIAGNOSIS — R0789 Other chest pain: Secondary | ICD-10-CM

## 2023-04-23 DIAGNOSIS — Z23 Encounter for immunization: Secondary | ICD-10-CM | POA: Diagnosis not present

## 2023-04-23 DIAGNOSIS — I10 Essential (primary) hypertension: Secondary | ICD-10-CM

## 2023-04-23 MED ORDER — FLUTICASONE PROPIONATE 50 MCG/ACT NA SUSP: 1.0000 | Freq: Every day | NASAL | 2 refills | Status: DC

## 2023-04-23 MED ORDER — ALBUTEROL SULFATE HFA 108 (90 BASE) MCG/ACT IN AERS: 2.0000 | INHALATION_SPRAY | Freq: Four times a day (QID) | RESPIRATORY_TRACT | 2 refills | Status: DC | PRN

## 2023-04-23 MED ORDER — MELOXICAM 7.5 MG PO TABS: 7.5000 mg | ORAL_TABLET | Freq: Every day | ORAL | 0 refills | Status: DC

## 2023-04-23 NOTE — Progress Notes (Unsigned)
    SUBJECTIVE:   CHIEF COMPLAINT / HPI:   Shawn Stark is a 38yo M w/ hx of chronic back pain, GERD, chronic costochondritis, HTN that p/f back pain, chest tightness, and sick symptoms.  - Has chronic back pain, most recently saw PCP 2/11 for this and given short course of robaxin.    - 10 days of acute worsening of mid and lower back pain. - Pain is worse at night - This feels similar to his chronic back pain, but acutely worse - Around this time, he is also having chest tightne ss and SOB. Also having worsening snoring when he sleeps. He chest/sternal area feels very tight in the morning. Thinks he may have some wheezing too. - Reports having nasal congestion and runny nose - Also having sneezing but no cough - Reports subjective fever - Throat feels sore - No sick contacts - Never had asthma. Denies allergies.  - Used to smoke, but now has quit smoking for over a year now.  - Has had these symptoms back in [place] as well, many years ago before he moved to [place].   OBJECTIVE:   BP (!) 143/104   Pulse 75   Ht 5\' 9"  (1.753 m)   Wt 190 lb 12.8 oz (86.5 kg)   SpO2 100%   BMI 28.18 kg/m   General: Alert, pleasant well-appearing man speaking comfortably in full sentences on room air. NAD. HEENT:Oropharynx erythematous, no plaques. Nasal turbinates edematous and erythematous.  CV: RRR, no murmurs. Cap refill <2. Resp: CTAB, no wheezing or crackles. Normal WOB on RA.  Abm: Soft, nontender, nondistended. BS present. Ext: Moves all ext spontaneously Skin: Warm, well perfused    ASSESSMENT/PLAN:   Assessment & Plan Chest tightness Differential includes viral URI, seasonal allergies, asthma, or a combination of them. Less likely ACS given VSS and duration of symptoms.  - Start flonase daily for congestion - Start abuterol 1 puff every 4 hours as needed for chest tightness and wheezing  Chronic back pain, unspecified back location, unspecified back pain laterality Acute on chronic  exacerbation of back pain.  No preceding trauma. -Start meloxicam 1 tablet daily for 10 days Primary hypertension Above goal -Continue amlodipine 5 mg daily. F/u in 2 weeks w/ PCP     Lincoln Brigham, MD [facility]

## 2023-04-23 NOTE — Patient Instructions (Addendum)
????? ?????? ????? - ????? ???????.  ?????? ???? ???????? ?????:  ?) ?????? ?????? ???????. ?? ???? ????? ?????? ?? ????.  - ?????? ??? ?????????? ????? ?? ?? ???? ??? ??? ????? ??????.  ?) ???? ????? ???????? ?? ???? ????? ????? ???????? ??? ????? ?? ???????? ?? ????.  - ?????? ??? ????????? ????? ?? ? ????? ??? ?????? ???? ????? ???????. ?????? ??? ????? ????? ?????? ???? ?? ?????.  - ?????? ??? ??? ??? ????? ??????. ????? ?? ???? ????? ??? ?????? ?? ???? ??? ????? ??? ????.  ?) ????? ?????.  - ????? ??? ?????????? ???? ?????? ???? ?? ????.  ?) ??? ??? ??? ??????? ?????. ????? ?????? ?????? ??? ????? ?? ??????? ?????? ??? ??? ??? ??????? ?? ???????.      Good to see you today - Thank you for coming in  Things we discussed today:  1) You sinuses are inflamed. This could be from allergies or an infection.  - Use fluticasone 1 spray in each nostril once a day  2) For your chest tightness and wheezing, this can also be due to inflammation such as asthma, allergies, or an infection. - Use albuterol 1 puff every 4 hours as needed for chest tightness and wheezing. Inhale 1 puff and hold your breath for 10 seconds. - Let us know if this helps with your symptoms. This medicine usually kicks in within a few minutes to an hour.  3) For you back pain - Take Meloxicam 1 tablet every day for 10 days  4) Your blood pressure was high today. Please come back in 1-2 weeks to recheck your blood pressure and check on your symptoms.

## 2023-04-25 NOTE — Assessment & Plan Note (Signed)
 Above goal -Continue amlodipine 5 mg daily. F/u in 2 weeks w/ PCP

## 2023-04-25 NOTE — Assessment & Plan Note (Signed)
 Acute on chronic exacerbation of back pain.  No preceding trauma. -Start meloxicam 1 tablet daily for 10 days

## 2023-05-21 ENCOUNTER — Ambulatory Visit: Admitting: Family Medicine

## 2023-05-21 ENCOUNTER — Encounter: Payer: Self-pay | Admitting: Family Medicine

## 2023-05-21 VITALS — BP 130/84 | HR 82 | Wt 193.0 lb

## 2023-05-21 DIAGNOSIS — K2100 Gastro-esophageal reflux disease with esophagitis, without bleeding: Secondary | ICD-10-CM

## 2023-05-21 DIAGNOSIS — Z87891 Personal history of nicotine dependence: Secondary | ICD-10-CM

## 2023-05-21 DIAGNOSIS — M549 Dorsalgia, unspecified: Secondary | ICD-10-CM | POA: Diagnosis not present

## 2023-05-21 DIAGNOSIS — R0789 Other chest pain: Secondary | ICD-10-CM

## 2023-05-21 DIAGNOSIS — G8929 Other chronic pain: Secondary | ICD-10-CM

## 2023-05-21 DIAGNOSIS — I10 Essential (primary) hypertension: Secondary | ICD-10-CM | POA: Diagnosis not present

## 2023-05-21 MED ORDER — AMLODIPINE BESYLATE 5 MG PO TABS: 5.0000 mg | ORAL_TABLET | Freq: Every day | ORAL | 1 refills | Status: DC

## 2023-05-21 MED ORDER — PANTOPRAZOLE SODIUM 40 MG PO TBEC: 40.0000 mg | DELAYED_RELEASE_TABLET | Freq: Every day | ORAL | 3 refills | Status: DC

## 2023-05-21 MED ORDER — FLUTICASONE PROPIONATE 50 MCG/ACT NA SUSP: 1.0000 | Freq: Every day | NASAL | 2 refills | Status: DC

## 2023-05-21 NOTE — Patient Instructions (Addendum)
??? ????? ????? ???? ??? ???? (?????????)? ????? ???????? (?????????)? ????? ???????? (??????). ????? ??? ??????? ??????. ?? ?????? ????????? ?????? ??? ???????. ??? ????? ????? ?? ???? ??? ????? ????? ?? ?????? ???????. ??? ????? ????? ?????!    I have refilled your blood pressure medicine (amlodipine ), reflux medicine (protonix ), and allergy medicine (flonase ). Take these every day. Do NOT take albuterol  every day unless needed. If you have dizziness with the blood pressure medicine, stop taking it and let me know. I am happy you feel better!

## 2023-05-21 NOTE — Progress Notes (Signed)
    SUBJECTIVE:   CHIEF COMPLAINT / HPI:   Chest tightness Previously seen 3/25 for this and felt could be due to viral process/allergy/asthma.  He was given Flonase  daily and also started on albuterol . His symptoms have improved with this regimen.  Chronic back pain Had an acute flare of back pain at visit on 3/25 as well, he was given meloxicam  for 10 days. His symptoms have improved.  Hypertension Noted to be elevated at last visit.  He was continued on amlodipine  5 mg daily and advised to follow-up today.  PERTINENT  PMH / PSH: GERD, costochondritis, latent TB, eosinophilia, history tobacco use now quit for 1 year  OBJECTIVE:   BP 130/84   Pulse 82   Wt 193 lb (87.5 kg)   SpO2 98%   BMI 28.50 kg/m   General: Alert and oriented, in NAD Skin: Warm, dry, and intact without lesions HEENT: NCAT, EOM grossly normal, midline nasal septum Cardiac: RRR, no m/r/g appreciated Respiratory: CTAB, breathing and speaking comfortably on RA Abdominal: Soft, nontender, nondistended, normoactive bowel sounds Extremities: Moves all extremities grossly equally Neurological: No gross focal deficit Psychiatric: Appropriate mood and affect  ASSESSMENT/PLAN:   Assessment & Plan Chest tightness Improved with Flonase  daily and as needed albuterol .  Will refill Flonase  today.  Advised to only use albuterol  as needed. Chronic bilateral back pain, unspecified back location Improved with meloxicam .  Continue conservative management for now.  Would hold off on consistent use of meloxicam  given history of gastritis. Primary hypertension Controlled today without amlodipine ; however, tighter control can be achieved.  Advised to take amlodipine  5 mg daily and let me know if he has any side effects of lightheadedness/dizziness.  Refilled. Gastroesophageal reflux disease with esophagitis without hemorrhage Symptoms mostly controlled without Protonix .  Advised he can continue to take this as needed or  daily per GI recommendations for history of gastritis.   Genetta Kenning, MD [facility]

## 2023-05-22 NOTE — Assessment & Plan Note (Signed)
 Controlled today without amlodipine ; however, tighter control can be achieved.  Advised to take amlodipine  5 mg daily and let me know if he has any side effects of lightheadedness/dizziness.  Refilled.

## 2023-05-22 NOTE — Assessment & Plan Note (Signed)
 Improved with meloxicam .  Continue conservative management for now.  Would hold off on consistent use of meloxicam  given history of gastritis.

## 2023-05-22 NOTE — Assessment & Plan Note (Signed)
 Symptoms mostly controlled without Protonix .  Advised he can continue to take this as needed or daily per GI recommendations for history of gastritis.

## 2023-06-13 ENCOUNTER — Ambulatory Visit: Attending: Family Medicine

## 2023-06-13 ENCOUNTER — Encounter: Payer: Self-pay | Admitting: Family Medicine

## 2023-06-13 ENCOUNTER — Ambulatory Visit: Admitting: Family Medicine

## 2023-06-13 VITALS — BP 139/97 | HR 76 | Ht 69.0 in | Wt 192.4 lb

## 2023-06-13 DIAGNOSIS — G44219 Episodic tension-type headache, not intractable: Secondary | ICD-10-CM | POA: Diagnosis not present

## 2023-06-13 DIAGNOSIS — I10 Essential (primary) hypertension: Secondary | ICD-10-CM | POA: Diagnosis not present

## 2023-06-13 DIAGNOSIS — R0789 Other chest pain: Secondary | ICD-10-CM | POA: Diagnosis not present

## 2023-06-13 DIAGNOSIS — R002 Palpitations: Secondary | ICD-10-CM

## 2023-06-13 DIAGNOSIS — R079 Chest pain, unspecified: Secondary | ICD-10-CM

## 2023-06-13 MED ORDER — DICLOFENAC SODIUM 1 % EX GEL: 2.0000 g | Freq: Four times a day (QID) | CUTANEOUS | 0 refills | Status: DC

## 2023-06-13 MED ORDER — FLUTICASONE PROPIONATE 50 MCG/ACT NA SUSP: 1.0000 | Freq: Every day | NASAL | 2 refills | Status: DC

## 2023-06-13 NOTE — Progress Notes (Unsigned)
 EP to read.

## 2023-06-13 NOTE — Patient Instructions (Addendum)
??? ????? ???? ?????? ????? ???? ???? ????? ????. ???? ?????? ??? ?????. ?? ??? ??? ???????.  ????? ???????? ???? ??? ?? ? ????? ??? ?????? ??????. ???? ????? ???????? ???? ???? ????? ????? ?????? ?????? ?????. ????? ????? ?? ???????? ????? ??? ??????. ???? ????? ???????? ????? ???? ??? ???? ????? ???????.    I have ordered a heart monitor to place to monitor your heart rate. This will be sent to your house. Come back to have this applied.  Take tylenol  1000 mg every 6 hours as needed for headaches. Also recommend using warm wash rag with water to help relieve pain. I will also send in voltaren  gel to apply to the neck. I would also recommend getting a better pillow where your neck is straight.

## 2023-06-13 NOTE — Progress Notes (Unsigned)
    SUBJECTIVE:   CHIEF COMPLAINT / HPI:   Primary HTN On amlodipine  5 mg daily. Has been taking this without issue.  Palpitations, chest pain Had 3 episodes over the last 2 weeks. Lasts 10 minutes. Palpitations sometimes wakes him up at night. He feels scared with these and is unsure of what to do. He does feel dizziness and nausea when this occurs. Happens at all times of the day. Chest pain does not usually occur with the palpitations. The chest pain is the same as before.  Headaches Very severe. On top and all around his head. Been going on for 2 weeks. He wakes up sometimes with it. Has not taken any medications for this. He feels more stressed and anxious recently when thinking about work and life. He feels like amlodipine  helps the headache.  PERTINENT  PMH / PSH: costochondral chest pain, GERD, chronic back pain  OBJECTIVE:   BP (!) 139/97   Pulse 76   Ht 5\' 9"  (1.753 m)   Wt 192 lb 6 oz (87.3 kg)   SpO2 100%   BMI 28.41 kg/m   General: Alert and oriented, in NAD Skin: Warm, dry, and intact without lesions HEENT: NCAT, EOM grossly normal, midline nasal septum Cardiac: RRR, no m/r/g appreciated Respiratory: CTAB, breathing and speaking comfortably on RA Abdominal: Soft, nontender, nondistended, normoactive bowel sounds Extremities: Moves all extremities grossly equally Neurological: No gross focal deficit Psychiatric: Appropriate mood and affect  ASSESSMENT/PLAN:   Assessment & Plan Chest pain, unspecified type      Genetta Kenning, MD [facility]

## 2023-06-14 ENCOUNTER — Telehealth: Payer: Self-pay

## 2023-06-14 DIAGNOSIS — R002 Palpitations: Secondary | ICD-10-CM

## 2023-06-14 DIAGNOSIS — G44219 Episodic tension-type headache, not intractable: Secondary | ICD-10-CM | POA: Insufficient documentation

## 2023-06-14 HISTORY — DX: Palpitations: R00.2

## 2023-06-14 NOTE — Telephone Encounter (Signed)
 [employer] [place] ID: [id number]

## 2023-06-14 NOTE — Assessment & Plan Note (Signed)
 Reassuringly with normal neurological exam.  Advised Tylenol  1000 mg every 6 hours as needed given unable to use NSAIDs with history of gastritis.  Also advised warm compresses and Voltaren  gel to the neck to help with tension.  Also discussed adjusting pillow at night.  Follow-up in 2 weeks and can consider muscle relaxer at that time.

## 2023-06-14 NOTE — Assessment & Plan Note (Signed)
 Likely etiology of intermittent chest discomfort given EKG similar to baseline without acute ischemic changes as well as tenderness of the chest to palpation.  Could consider contributions of elevated BP (though not severely elevated) and palpitations.  Continue Tylenol  for now.  Advised to go to ED should symptoms worsen before seeing me in 2 weeks. Remainder of management below.

## 2023-06-14 NOTE — Assessment & Plan Note (Signed)
 Exam reassuring today and patient comfortable.  Given report of palpitations, will order Zio patch for further evaluation.  Will plan to monitor for 14 days.  Given Arabic speaking, I have made an appointment with myself in 2 weeks to place monitor in the clinic for patient.

## 2023-06-14 NOTE — Telephone Encounter (Signed)
 Attempted to call patient twice per Dr. Sue Em request using [employer] to inform patient that he should be receiving his zio patch(heart monitor) in the mail and to bring it to his upcoming appointment so Dr. Fredrik Jensen can apply it.  Couldn't leave VM due to VM isnt set up.  Christ Courier, CMA

## 2023-06-14 NOTE — Progress Notes (Signed)
    SUBJECTIVE:   CHIEF COMPLAINT / HPI:   Primary HTN On amlodipine  5 mg daily. Has been taking this without issue.  Palpitations, chest pain Had 3 episodes over the last 2 weeks. Lasts 10 minutes. Palpitations sometimes wakes him up at night. He feels scared with these and is unsure of what to do. He does feel dizziness and nausea when this occurs. Happens at all times of the day. Chest pain does not usually occur with the palpitations. The chest pain is the same as before.  Headaches Very severe. On top and all around his head. Been going on for 2 weeks. He wakes up sometimes with it. Has not taken any medications for this. He feels more stressed and anxious recently when thinking about work and life. He feels like amlodipine  helps the headache.  PERTINENT  PMH / PSH: costochondral chest pain, GERD, chronic back pain  OBJECTIVE:   BP (!) 139/97   Pulse 76   Ht 5\' 9"  (1.753 m)   Wt 192 lb 6 oz (87.3 kg)   SpO2 100%   BMI 28.41 kg/m   General: Alert and oriented, in NAD Skin: Warm, dry, and intact HEENT: NCAT, EOM grossly normal, midline nasal septum Cardiac: RRR, no m/r/g appreciated, tenderness to palpation over left anterior chest Respiratory: CTAB, breathing and speaking comfortably on RA Extremities: Moves all extremities grossly equally Neurological: No gross focal deficit, cranial nerves II through XII intact, strength and sensation intact throughout, normal gait, appears at baseline from previous visits Psychiatric: Appropriate mood and affect, normal thought process  ASSESSMENT/PLAN:   Assessment & Plan Costochondral chest pain Likely etiology of intermittent chest discomfort given EKG similar to baseline without acute ischemic changes as well as tenderness of the chest to palpation.  Could consider contributions of elevated BP (though not severely elevated) and palpitations.  Continue Tylenol  for now.  Advised to go to ED should symptoms worsen before seeing me in 2  weeks. Remainder of management below. Palpitations Exam reassuring today and patient comfortable.  Given report of palpitations, will order Zio patch for further evaluation.  Will plan to monitor for 14 days.  Given Arabic speaking, I have made an appointment with myself in 2 weeks to place monitor in the clinic for patient. Primary hypertension Elevated today on recheck after only taking amlodipine  5 mg daily.  Will go up to amlodipine  10 mg daily and recheck at visit in 2 weeks. Episodic tension-type headache, not intractable Reassuringly with normal neurological exam.  Advised Tylenol  1000 mg every 6 hours as needed given unable to use NSAIDs with history of gastritis.  Also advised warm compresses and Voltaren  gel to the neck to help with tension.  Also discussed adjusting pillow at night.  Follow-up in 2 weeks and can consider muscle relaxer at that time.   Genetta Kenning, MD [facility]

## 2023-06-14 NOTE — Assessment & Plan Note (Signed)
 Elevated today on recheck after only taking amlodipine  5 mg daily.  Will go up to amlodipine  10 mg daily and recheck at visit in 2 weeks.

## 2023-06-17 ENCOUNTER — Encounter: Payer: Self-pay | Admitting: Internal Medicine

## 2023-06-17 ENCOUNTER — Ambulatory Visit: Payer: Medicaid Other | Admitting: Internal Medicine

## 2023-06-17 VITALS — BP 148/80 | HR 87 | Ht 71.0 in | Wt 196.0 lb

## 2023-06-17 DIAGNOSIS — K5900 Constipation, unspecified: Secondary | ICD-10-CM

## 2023-06-17 DIAGNOSIS — R1013 Epigastric pain: Secondary | ICD-10-CM

## 2023-06-17 DIAGNOSIS — K921 Melena: Secondary | ICD-10-CM

## 2023-06-17 DIAGNOSIS — R042 Hemoptysis: Secondary | ICD-10-CM

## 2023-06-17 DIAGNOSIS — R0789 Other chest pain: Secondary | ICD-10-CM

## 2023-06-17 DIAGNOSIS — K219 Gastro-esophageal reflux disease without esophagitis: Secondary | ICD-10-CM

## 2023-06-17 DIAGNOSIS — Z8719 Personal history of other diseases of the digestive system: Secondary | ICD-10-CM

## 2023-06-17 MED ORDER — PANTOPRAZOLE SODIUM 40 MG PO TBEC: 40.0000 mg | DELAYED_RELEASE_TABLET | Freq: Two times a day (BID) | ORAL | 3 refills | Status: DC

## 2023-06-17 NOTE — Progress Notes (Signed)
 Chief Complaint: Epigastric ab pain  HPI : 38 year old male with history of GERD and prior esophagitis presents for follow up of epigastric ab pain  Interval History: Endorses pain in the chest as well as epigastric ab pain, which is similar to what he has felt in the past. When he was taking the pantoprazole  40 mg BID, his discomfort improves. He is currently on the pantoprazole  once daily.  Endorses nausea. Denies vomiting. He is spitting up the blood in his sputum. Endorses black stools on occasion. He has occasional constipation.  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/14/2020   Tinea pedis of left foot 08/09/2020   Past Surgical History:  Procedure Laterality Date   UPPER GASTROINTESTINAL ENDOSCOPY     Family History  Problem Relation Age of Onset   Healthy Mother    Heart disease Mother    Healthy Father    Liver disease Neg Hx    Esophageal cancer Neg Hx    Colon cancer Neg Hx    Rectal cancer Neg Hx    Stomach cancer Neg Hx    Social History   Tobacco Use   Smoking status: Former    Types: Cigarettes    Passive exposure: Past   Smokeless tobacco: Never  Vaping Use   Vaping status: Never Used  Substance Use Topics   Alcohol use: Never   Drug use: Never   Current Outpatient Medications  Medication Sig Dispense Refill   albuterol  (VENTOLIN  HFA) 108 (90 Base) MCG/ACT inhaler Inhale 2 puffs into the lungs every 6 (six) hours as needed for wheezing or shortness of breath. 8 g 2   amLODipine  (NORVASC ) 5  MG tablet Take 1 tablet (5 mg total) by mouth at bedtime. 90 tablet 1   diclofenac  Sodium (VOLTAREN ) 1 % GEL Apply 2 g topically 4 (four) times daily. 350 g 0   fluticasone  (FLONASE ) 50 MCG/ACT nasal spray Place 1 spray into both nostrils daily. 11.1 mL 2   Heating Pads (ADVOCATE HEATING PAD) PADS 1 each by Does not apply route as needed. 1 each 0   methocarbamol  (ROBAXIN ) 500 MG tablet Take 1 tablet (500 mg total) by mouth every 8 (eight) hours as needed for muscle spasms. 30 tablet 0   pantoprazole  (PROTONIX ) 40 MG tablet Take 1 tablet (40 mg total) by mouth daily. 90 tablet 3   Wheat Dextrin (BENEFIBER) POWD Take 1 Scoop by mouth daily.  0   No current facility-administered medications for this visit.   No Known Allergies   Physical Exam: BP (!) 148/80   Pulse 87   Ht 5\' 11"  (1.803 m)   Wt 196 lb (88.9 kg)   BMI 27.34 kg/m  Constitutional: Pleasant,well-developed, male in no acute distress. HEENT: Normocephalic and atraumatic. Conjunctivae are normal. No scleral icterus. Cardiovascular: Normal rate Pulmonary/chest: Effort normal Abdominal: Soft, nondistended, tender in the epigastric area. Bowel sounds active throughout. There are no masses palpable. No hepatomegaly. Extremities: No edema Neurological: Alert and oriented  to person place and time. Skin: Skin is warm and dry. No rashes noted. Psychiatric: Normal mood and affect. Behavior is normal.  Labs 07/2020: TSH nml  Labs 08/2020: Lipase nml. CMP unremarkable. CBC unremarkable.  Labs 12/2021: H pylori breath test negative.  Labs 03/2022: CBC with low plts of 120.   Labs 05/2022: BMP nml.   Labs 12/2022: CBC with nml Hb and plts  Labs 03/2023: CBC with nml Hb and plts  TTE 08/22/20: 1. Left ventricular ejection fraction, by estimation, is 60 to 65%. The left ventricle has normal function. The left ventricle has no regional wall motion abnormalities. Left ventricular diastolic parameters were  normal.   2. Right  ventricular systolic function is normal. The right ventricular size is normal.   3. The mitral valve is grossly normal. Trivial mitral valve  regurgitation.   4. The aortic valve is tricuspid. Aortic valve regurgitation is not visualized.   RUQ U/S 09/20/20: IMPRESSION: Unremarkable RIGHT upper quadrant sonogram. No signs of acute biliary process.  EGD 06/12/22: - LA Grade C reflux esophagitis with no bleeding. Biopsied. - Gastritis. Biopsied. - Normal examined duodenum. Path: 1. Surgical [P], gastric antrum and gastric body GASTRIC ANTRAL / OXYNTIC MUCOSA WITH FOCAL MILD CHRONIC INACTIVE GASTRITIS. NO H. PYLORI IDENTIFIED ON H&E. NEGATIVE FOR INTESTINAL METAPLASIA OR DYSPLASIA. 2. Surgical [P], esophagus ESOPHAGEAL SQUAMOUS MUCOSA WITH REACTIVE EPITHELIAL CHANGES AND RARE INTRAEPITHELIAL LYMPHOCYTES SUGGESTIVE OF MILD REFLUX DISEASE. NEGATIVE FOR DYSPLASIA OR MALIGNANCY.  EGD 08/21/22: - Normal esophagus. - Normal stomach. - A single duodenal polyp. Resected and retrieved. Path: Surgical [P], duodenal polyp, polyp (1) - POLYPOID FRAGMENT OF BENIGN SMALL BOWEL MUCOSA WITH FOVEOLAR METAPLASIA, SUGGESTIVE OF PEPTIC INJURY  ASSESSMENT AND PLAN: Epigastric ab pain/chest discomfort Spitting up of blood Melena GERD Constipation History of thrombocytopenia History of latent TB Patient has had a recurrence in his epigastric ab pain and chest discomfort, which was previously attributed to grade C esophagitis. Patient improved on pantoprazole  BID previously so will have him increase this medication to BID indefinitely since he has not tolerated two attempts to taper down on this dose. Luckily his last Hb was normal, suggesting that he is not losing a substantial amount of blood. Patient does describes some hemoptysis and looks like he has a history of latent TB so will reach out to his PCP to see if he may need further work up or a referral to infectious disease.  - Previously gave GERD  handout  - Can use senna and/or Miralax  PRN for constipation - Increase pantoprazole  from QD to BID - Recommended that he reach out to his PCP about his hemoptysis. I also messaged Dr. Fredrik Jensen to notify him of the patient's hemoptysis - RTC in 3 months  Regino Caprio, MD  I spent 33 minutes of time, including in depth chart review, independent review of results as outlined above, communicating results with the patient directly, face-to-face time with the patient, coordinating care, ordering studies and medications as appropriate, and documentation.

## 2023-06-17 NOTE — Telephone Encounter (Signed)
 Called [employer] [place] ID: [id number] to contact patient. Was able to contact patient to inform him about receiving his zio patch in the mail and to bring his zio patch (heart monitor) to his upcoming appointment 06/28/23 and that Dr. Fredrik Jensen will apply it on during visit. Patient stated that he did received it in the mail and that he will bring it to his upcoming appointment.  Christ Courier, CMA

## 2023-06-17 NOTE — Patient Instructions (Addendum)
 We have sent the following medications to your pharmacy for you to pick up at your convenience: Pantoprazole   _______________________________________________________  If your blood pressure at your visit was 140/90 or greater, please contact your primary care physician to follow up on this.  _______________________________________________________  If you are age 37 or older, your body mass index should be between 23-30. Your Body mass index is 27.34 kg/m. If this is out of the aforementioned range listed, please consider follow up with your Primary Care Provider.  If you are age 57 or younger, your body mass index should be between 19-25. Your Body mass index is 27.34 kg/m. If this is out of the aformentioned range listed, please consider follow up with your Primary Care Provider.   ________________________________________________________  The [facility] providers would like to encourage you to use MYCHART to communicate with providers for non-urgent requests or questions.  Due to long hold times on the telephone, sending your provider a message by [facility] may be a faster and more efficient way to get a response.  Please allow 48 business hours for a response.  Please remember that this is for non-urgent requests.  _______________________________________________________  Thank you for entrusting me with your care and for choosing [facility], Dr. Regino Caprio

## 2023-06-27 NOTE — Progress Notes (Signed)
    SUBJECTIVE:   CHIEF COMPLAINT / HPI:   Palpitations Is here to have ZIO placed.  Sick symptoms, report of hemoptysis For the last week, he has been having nasal congestion, headache, sore throat, and cough productive of yellow sputum.  He has felt warm, but no objective fevers.  No nausea or vomiting.  No sick contacts.  He has been taking Afrin and Mucinex with Tylenol  which has been helping.  Of note, as mentioned to GI at his previous office visit, he reported hemoptysis; appreciate GI care with this patient.  However, today, he denies having hemoptysis recently.  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  Chest x-ray at that time without abnormality.  He was reportedly treated at the health department with isoniazid.  He has since had a chest x-ray in 2023 that was also normal.   PERTINENT  PMH / PSH: Hypertension, GERD, costochondral chest pain, history of tobacco use, tension type headache  OBJECTIVE:   BP (!) 145/91   Pulse 85   Temp 99.9 F (37.7 C)   Ht 5\' 11"  (1.803 m)   Wt 194 lb 9.6 oz (88.3 kg)   SpO2 99%   BMI 27.14 kg/m   General: Alert and oriented, in NAD Skin: Warm, dry, and intact without lesions HEENT: NCAT, EOM grossly normal, midline nasal septum, bilateral nares with mucus and swelling of nasal turbinates, mildly posterior oropharyngeal erythema Cardiac: RRR, no m/r/g appreciated Respiratory/Chest: CTAB, breathing and speaking comfortably on RA, no chest wall pain today Extremities: Moves all extremities grossly equally Neurological: No gross focal deficit Psychiatric: Appropriate mood and affect  ASSESSMENT/PLAN:   Assessment & Plan Palpitations Not appreciated on exam today.  Zio patch placed without difficulty.  Advised patient to press button when symptoms appear.  Also advised to write down symptoms in the booklet.  Discussed removing on 07/12/2023 and mailing back to mailbox.  He will let me know if he has any issues before then. Primary  hypertension Elevated today, likely in the setting of viral illness.  Continue amlodipine .  Reassess at next visit when he is not acutely ill. Viral URI with cough History and exam most concerning for viral process.  Reassuringly no focal findings on exam.  Recommended conservative management with continued OTC meds including Mucinex and afrin sparingly. Discussed returning to care should he worsen or not improve.   Genetta Kenning, MD [facility]

## 2023-06-28 ENCOUNTER — Ambulatory Visit: Payer: Self-pay | Admitting: Family Medicine

## 2023-06-28 ENCOUNTER — Encounter: Payer: Self-pay | Admitting: Family Medicine

## 2023-06-28 VITALS — BP 145/91 | HR 85 | Temp 99.9°F | Ht 71.0 in | Wt 194.6 lb

## 2023-06-28 DIAGNOSIS — R002 Palpitations: Secondary | ICD-10-CM

## 2023-06-28 DIAGNOSIS — I10 Essential (primary) hypertension: Secondary | ICD-10-CM

## 2023-06-28 DIAGNOSIS — J069 Acute upper respiratory infection, unspecified: Secondary | ICD-10-CM | POA: Diagnosis not present

## 2023-06-28 NOTE — Patient Instructions (Addendum)
 Your headaches, cough, congestion, and feeling warm is likely due to a viral infection. Let me know if you have any problems with this.  I have applied your heart monitor. Keep this on for 14 days. Take this off on 07/12/23 and place in box. Put this box in your mailbox to be shipped off. Let me know if you have any issues with this!  ?? ?????? ?? ???? ??? ?????? ??????? ????????? ??????? ?????? ???? ???????. ?????? ??? ????? ?? ????? ?? ???.  ??? ????? ???? ?????? ????? ????? ??. ????? ???? ?? ?????. ????? ?? ?? ????? ???? ???? ?? ?????. ?? ??? ??????? ?? ????? ????? ???? ????. ?????? ??? ????? ?? ????? ?? ???!

## 2023-06-28 NOTE — Assessment & Plan Note (Signed)
 Elevated today, likely in the setting of viral illness.  Continue amlodipine .  Reassess at next visit when he is not acutely ill.

## 2023-06-28 NOTE — Assessment & Plan Note (Signed)
 Not appreciated on exam today.  Zio patch placed without difficulty.  Advised patient to press button when symptoms appear.  Also advised to write down symptoms in the booklet.  Discussed removing on 07/12/2023 and mailing back to mailbox.  He will let me know if he has any issues before then.

## 2023-08-30 DIAGNOSIS — R002 Palpitations: Secondary | ICD-10-CM

## 2023-09-02 ENCOUNTER — Ambulatory Visit: Payer: Self-pay | Admitting: Family Medicine

## 2023-09-10 ENCOUNTER — Ambulatory Visit: Admitting: Internal Medicine

## 2023-09-10 ENCOUNTER — Encounter: Payer: Self-pay | Admitting: Internal Medicine

## 2023-09-10 DIAGNOSIS — K5900 Constipation, unspecified: Secondary | ICD-10-CM | POA: Diagnosis not present

## 2023-09-10 DIAGNOSIS — R0789 Other chest pain: Secondary | ICD-10-CM | POA: Diagnosis not present

## 2023-09-10 DIAGNOSIS — R110 Nausea: Secondary | ICD-10-CM | POA: Diagnosis not present

## 2023-09-10 DIAGNOSIS — R1013 Epigastric pain: Secondary | ICD-10-CM | POA: Diagnosis not present

## 2023-09-10 DIAGNOSIS — Z8719 Personal history of other diseases of the digestive system: Secondary | ICD-10-CM

## 2023-09-10 DIAGNOSIS — R1032 Left lower quadrant pain: Secondary | ICD-10-CM

## 2023-09-10 DIAGNOSIS — Z862 Personal history of diseases of the blood and blood-forming organs and certain disorders involving the immune mechanism: Secondary | ICD-10-CM

## 2023-09-10 DIAGNOSIS — K219 Gastro-esophageal reflux disease without esophagitis: Secondary | ICD-10-CM

## 2023-09-10 MED ORDER — ONDANSETRON HCL 4 MG PO TABS: 4.0000 mg | ORAL_TABLET | Freq: Three times a day (TID) | ORAL | 0 refills | Status: DC | PRN

## 2023-09-10 MED ORDER — FAMOTIDINE 20 MG PO TABS: 20.0000 mg | ORAL_TABLET | Freq: Two times a day (BID) | ORAL | 3 refills | Status: DC

## 2023-09-10 NOTE — Progress Notes (Signed)
 Chief Complaint: Epigastric ab pain  HPI : 38 year old male with history of GERD and prior esophagitis presents for follow up of epigastric ab pain  Interval History: GERD is slightly better. PPI BID seems to be helping. He is taking the PPI before meals. When he is hungry, he will feel pain in his chest. Every morning when he wakes up, he will feel an abnormal sensation in his chest. He will feel numbness and burning. Denies constipation and diarrhea. Denies hemoptysis. Endorses some nausea in the middle of the day. Denies vomiting. Nausea seem to be associated with certain foods. Denies black stools. He has had some LLQ ab pain for the last 3-4 months.  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   Tinea pedis of left foot 08/09/2020   Past Surgical History:  Procedure Laterality Date   UPPER GASTROINTESTINAL ENDOSCOPY     Family History  Problem Relation Age of Onset   Healthy Mother    Heart disease Mother    Healthy Father    Liver disease Neg Hx    Esophageal cancer Neg Hx    Colon cancer Neg Hx    Rectal cancer Neg Hx    Stomach cancer Neg Hx    Social History   Tobacco Use   Smoking status: Former    Types: Cigarettes    Passive exposure: Past   Smokeless tobacco: Never  Vaping Use   Vaping status: Never Used  Substance Use Topics   Alcohol use: Never   Drug use: Never   Current Outpatient Medications  Medication Sig Dispense Refill   fluticasone  (FLONASE ) 50 MCG/ACT nasal spray Place 1 spray  into both nostrils daily. 11.1 mL 2   pantoprazole  (PROTONIX ) 40 MG tablet Take 1 tablet (40 mg total) by mouth 2 (two) times daily. 60 tablet 3   albuterol  (VENTOLIN  HFA) 108 (90 Base) MCG/ACT inhaler Inhale 2 puffs into the lungs every 6 (six) hours as needed for wheezing or shortness of breath. (Patient not taking: Reported on 09/10/2023) 8 g 2   amLODipine  (NORVASC ) 5 MG tablet Take 1 tablet (5 mg total) by mouth at bedtime. (Patient not taking: Reported on 09/10/2023) 90 tablet 1   diclofenac  Sodium (VOLTAREN ) 1 % GEL Apply 2 g topically 4 (four) times daily. (Patient not taking: Reported on 09/10/2023) 350 g 0   Heating Pads (ADVOCATE HEATING PAD) PADS 1 each by Does not apply route as needed. (Patient not taking: Reported on 09/10/2023) 1 each 0   methocarbamol  (ROBAXIN ) 500 MG tablet Take 1 tablet (500 mg total) by mouth every 8 (eight) hours as needed for muscle spasms. (Patient not taking: Reported on 09/10/2023) 30 tablet 0   Wheat Dextrin (BENEFIBER) POWD Take 1 Scoop by mouth daily. (Patient not taking: Reported on 09/10/2023)  0   No current facility-administered medications for this visit.   No Known Allergies   Physical Exam: BP 136/80   Pulse 84   Ht 5' 11 (1.803 m)  Wt 200 lb (90.7 kg)   BMI 27.89 kg/m  Constitutional: Pleasant,well-developed, male in no acute distress. HEENT: Normocephalic and atraumatic. Conjunctivae are normal. No scleral icterus. Cardiovascular: Normal rate Pulmonary/chest: Effort normal Abdominal: Soft, nondistended, tender in the LLQ. Bowel sounds active throughout. There are no masses palpable. No hepatomegaly. Extremities: No edema Neurological: Alert and oriented to person place and time. Skin: Skin is warm and dry. No rashes noted. Psychiatric: Normal mood and affect. Behavior is normal.  Labs 07/2020: TSH nml  Labs 08/2020: Lipase nml. CMP unremarkable. CBC unremarkable.  Labs 12/2021: H pylori breath test negative.  Labs 03/2022: CBC with  low plts of 120.   Labs 05/2022: BMP nml.   Labs 12/2022: CBC with nml Hb and plts  Labs 03/2023: CBC with nml Hb and plts  TTE 08/22/20: 1. Left ventricular ejection fraction, by estimation, is 60 to 65%. The left ventricle has normal function. The left ventricle has no regional wall motion abnormalities. Left ventricular diastolic parameters were  normal.   2. Right ventricular systolic function is normal. The right ventricular size is normal.   3. The mitral valve is grossly normal. Trivial mitral valve  regurgitation.   4. The aortic valve is tricuspid. Aortic valve regurgitation is not visualized.   RUQ U/S 09/20/20: IMPRESSION: Unremarkable RIGHT upper quadrant sonogram. No signs of acute biliary process.  EGD 06/12/22: - LA Grade C reflux esophagitis with no bleeding. Biopsied. - Gastritis. Biopsied. - Normal examined duodenum. Path: 1. Surgical [P], gastric antrum and gastric body GASTRIC ANTRAL / OXYNTIC MUCOSA WITH FOCAL MILD CHRONIC INACTIVE GASTRITIS. NO H. PYLORI IDENTIFIED ON H&E. NEGATIVE FOR INTESTINAL METAPLASIA OR DYSPLASIA. 2. Surgical [P], esophagus ESOPHAGEAL SQUAMOUS MUCOSA WITH REACTIVE EPITHELIAL CHANGES AND RARE INTRAEPITHELIAL LYMPHOCYTES SUGGESTIVE OF MILD REFLUX DISEASE. NEGATIVE FOR DYSPLASIA OR MALIGNANCY.  EGD 08/21/22: - Normal esophagus. - Normal stomach. - A single duodenal polyp. Resected and retrieved. Path: Surgical [P], duodenal polyp, polyp (1) - POLYPOID FRAGMENT OF BENIGN SMALL BOWEL MUCOSA WITH FOVEOLAR METAPLASIA, SUGGESTIVE OF PEPTIC INJURY  ASSESSMENT AND PLAN: Epigastric ab pain/chest discomfort Nausea LLQ ab pain GERD Constipation History of grade C esophagitis History of thrombocytopenia History of latent TB Patient states that he has had some improvement in his chest discomfort and GERD on PPI twice daily therapy.  Perhaps he just had a little bit more breakthrough reflux so we will start him on Pepcid  to see if this is  adequate to be able to help control his symptoms.  Patient does have a confirmed history of true acid reflux with his history of grade C esophagitis on EGD.  Patient was tender on exam in the left lower quadrant today and mentions that this pain has been present for the last several months.  Will plan for a CT scan for further evaluation of this abdominal pain.  He also describes some nausea issues so we will give him some antinausea medication to try to keep this under better control. - Previously gave GERD handout  - Can use senna and/or Miralax  PRN for constipation - Continue pantoprazole  BID. Refill - Start Pepcid  20 mg BID - CT A/P w/contrast for evaluation of LLQ ab pain - Start Zofran  4 mg TID PRN for nausea - RTC in 3 months  Estefana Kidney, MD  I spent 33 minutes of time, including in depth chart review, independent review of results as outlined above, communicating results with the patient directly, face-to-face time with the patient, coordinating care, ordering studies and medications  as appropriate, and documentation.

## 2023-09-10 NOTE — Patient Instructions (Addendum)
 _______________________________________________________  If your blood pressure at your visit was 140/90 or greater, please contact your primary care physician to follow up on this.  _______________________________________________________  If you are age 37 or older, your body mass index should be between 23-30. Your Body mass index is 27.89 kg/m. If this is out of the aforementioned range listed, please consider follow up with your Primary Care Provider.  If you are age 106 or younger, your body mass index should be between 19-25. Your Body mass index is 27.89 kg/m. If this is out of the aformentioned range listed, please consider follow up with your Primary Care Provider.  ________________________________________________________  The [facility] providers would like to encourage you to use MYCHART to communicate with providers for non-urgent requests or questions.  Due to long hold times on the telephone, sending your provider a message by [facility] may be a faster and more efficient way to get a response.  Please allow 48 business hours for a response.  Please remember that this is for non-urgent requests.  _______________________________________________________  [facility] is using a team-based approach to care.  Your team is made up of your doctor and two to three APPS. Our APPS (Nurse Practitioners and Physician Assistants) work with your physician to ensure care continuity for you. They are fully qualified to address your health concerns and develop a treatment plan. They communicate directly with your gastroenterologist to care for you. Seeing the Advanced Practice Practitioners on your physician's team can help you by facilitating care more promptly, often allowing for earlier appointments, access to diagnostic testing, procedures, and other specialty referrals.   We have sent the following medications to your pharmacy for you to pick up at your convenience:  START: Pepcid  20mg   one tablet two times daily CONTINUE: pantoprazole  40mg  one tablet two times daily. START: Zofran  4mg  one tablet three times daily as needed  You have been scheduled for a CT scan of the abdomen and pelvis at [facility], 1st floor Radiology. You are scheduled on 09-17-23 at 1:30pm. You should arrive at 11:30am for registration and to drink 2 bottles of contrast. Please follow the written instructions below on the day of your exam:   1) Do not eat anything after 9:30am (4 hours prior to your test)   You may take any medications as prescribed with a small amount of water, if necessary. If you take any of the following medications: METFORMIN, GLUCOPHAGE, GLUCOVANCE, AVANDAMET, RIOMET, FORTAMET, ACTOPLUS MET, JANUMET, GLUMETZA or METAGLIP, you MAY be asked to HOLD this medication 48 hours AFTER the exam.   The purpose of you drinking the oral contrast is to aid in the visualization of your intestinal tract. The contrast solution may cause some diarrhea. Depending on your individual set of symptoms, you may also receive an intravenous injection of x-ray contrast/dye. Plan on being at [facility] for 45 minutes or longer, depending on the type of exam you are having performed.   If you have any questions regarding your exam or if you need to reschedule, you may call [facility] at [phone] between the hours of 8:00 am and 5:00 pm, Monday-Friday.   Due to recent changes in healthcare laws, you may see the results of your imaging and laboratory studies on MyChart before your provider has had a chance to review them.  We understand that in some cases there may be results that are confusing or concerning to you. Not all laboratory results come back in the same  time frame and the provider may be waiting for multiple results in order to interpret others.  Please give us  48 hours in order for your provider to thoroughly review all the results before contacting the office for clarification of  your results.   Thank you for entrusting me with your care and choosing [facility].  Dr Federico

## 2023-09-17 ENCOUNTER — Ambulatory Visit (HOSPITAL_COMMUNITY)
Admission: RE | Admit: 2023-09-17 | Discharge: 2023-09-17 | Disposition: A | Discharge status: Home / Self Care | Source: Ambulatory Visit | Attending: Internal Medicine | Admitting: Internal Medicine

## 2023-09-17 DIAGNOSIS — R1032 Left lower quadrant pain: Secondary | ICD-10-CM | POA: Diagnosis not present

## 2023-09-17 DIAGNOSIS — K760 Fatty (change of) liver, not elsewhere classified: Secondary | ICD-10-CM | POA: Diagnosis not present

## 2023-09-17 MED ORDER — IOHEXOL 9 MG/ML PO SOLN
500.0000 mL | ORAL | Status: AC
  Administered 2023-09-17 (×2): 500 mL via ORAL

## 2023-09-17 MED ORDER — IOHEXOL 300 MG/ML  SOLN
100.0000 mL | Freq: Once | INTRAMUSCULAR | Status: AC | PRN
  Administered 2023-09-17: 100 mL via INTRAVENOUS

## 2023-09-20 ENCOUNTER — Ambulatory Visit: Payer: Self-pay | Admitting: Internal Medicine

## 2023-09-20 NOTE — Progress Notes (Signed)
 Pod B triage, please let the patient know that his CT scan did not show an obvious source of his abdominal pain. He was noted to have mild fatty liver (the liver like other areas of the body can accumulate fat) and a 1.5 cm lesion in the right portion of the liver. I would recommend that we proceed with an MRI abdomen w/wo Eovist contrast to further evaluate this liver lesion.

## 2023-09-23 ENCOUNTER — Other Ambulatory Visit: Payer: Self-pay | Admitting: Internal Medicine

## 2023-09-23 ENCOUNTER — Other Ambulatory Visit: Payer: Self-pay

## 2023-09-23 DIAGNOSIS — R9389 Abnormal findings on diagnostic imaging of other specified body structures: Secondary | ICD-10-CM

## 2023-09-23 DIAGNOSIS — K769 Liver disease, unspecified: Secondary | ICD-10-CM

## 2023-10-09 ENCOUNTER — Encounter: Payer: Self-pay | Admitting: Internal Medicine

## 2023-10-10 ENCOUNTER — Encounter: Payer: Self-pay | Admitting: Internal Medicine

## 2023-10-24 ENCOUNTER — Ambulatory Visit
Admission: RE | Admit: 2023-10-24 | Discharge: 2023-10-24 | Disposition: A | Discharge status: Home / Self Care | Source: Ambulatory Visit | Attending: Internal Medicine | Admitting: Internal Medicine

## 2023-10-24 DIAGNOSIS — K769 Liver disease, unspecified: Secondary | ICD-10-CM

## 2023-10-24 DIAGNOSIS — R9389 Abnormal findings on diagnostic imaging of other specified body structures: Secondary | ICD-10-CM

## 2023-10-24 MED ORDER — GADOXETATE DISODIUM 0.25 MMOL/ML IV SOLN
8.0000 mL | Freq: Once | INTRAVENOUS | Status: AC | PRN
Start: 2023-10-24 — End: 2023-10-24
  Administered 2023-10-24: 8 mL via INTRAVENOUS

## 2023-10-25 ENCOUNTER — Ambulatory Visit: Payer: Self-pay | Admitting: Physician Assistant

## 2023-12-20 ENCOUNTER — Ambulatory Visit: Admitting: Family Medicine

## 2023-12-20 ENCOUNTER — Other Ambulatory Visit: Payer: Self-pay | Admitting: Family Medicine

## 2023-12-20 VITALS — BP 131/82 | HR 72 | Ht 71.0 in | Wt 202.0 lb

## 2023-12-20 DIAGNOSIS — K2100 Gastro-esophageal reflux disease with esophagitis, without bleeding: Secondary | ICD-10-CM | POA: Diagnosis not present

## 2023-12-20 DIAGNOSIS — R002 Palpitations: Secondary | ICD-10-CM

## 2023-12-20 DIAGNOSIS — I10 Essential (primary) hypertension: Secondary | ICD-10-CM

## 2023-12-20 DIAGNOSIS — R0789 Other chest pain: Secondary | ICD-10-CM

## 2023-12-20 MED ORDER — ADVOCATE HEATING PAD PADS: 1.0000 | MEDICATED_PAD | 0 refills | Status: AC | PRN

## 2023-12-20 MED ORDER — AMLODIPINE-OLMESARTAN 5-20 MG PO TABS: 1.0000 | ORAL_TABLET | Freq: Every day | ORAL | 1 refills | Status: DC

## 2023-12-20 MED ORDER — BENEFIBER PO POWD: 1.0000 | Freq: Every day | ORAL | 0 refills | Status: AC

## 2023-12-20 MED ORDER — FLUTICASONE PROPIONATE 50 MCG/ACT NA SUSP: 1.0000 | Freq: Every day | NASAL | 2 refills | Status: AC

## 2023-12-20 MED ORDER — PANTOPRAZOLE SODIUM 40 MG PO TBEC: 40.0000 mg | DELAYED_RELEASE_TABLET | Freq: Two times a day (BID) | ORAL | 3 refills | Status: AC

## 2023-12-20 MED ORDER — FAMOTIDINE 20 MG PO TABS: 20.0000 mg | ORAL_TABLET | Freq: Two times a day (BID) | ORAL | 3 refills | Status: AC

## 2023-12-20 NOTE — Assessment & Plan Note (Signed)
 Controlled with PPI and H2B, though he does not have this anymore. Unlikely intestinal infection given resolution with medication and presumptive treatment before arrival in the [place]  in [place]22. - Pantoprazole  and famotidine  refilled - Continue to follow with GI

## 2023-12-20 NOTE — Assessment & Plan Note (Addendum)
 Controlled, though amlodipine  caused fatigue and discomfort, leading to intermittent use. - Switched to amlodipine -olmesartan  5-20 mg daily from 10 mg of amlodipine  alone - Scheduled follow-up in one week for re-evaluation and blood work.

## 2023-12-20 NOTE — Patient Instructions (Addendum)
???? ???????:  ?????? ????? ???? ????? ????????? ?????? ????? ?????? ????? ?????? ??? ????. ?????? ??? ????????? ??? ?????? ?????? ????? ???????? ?????? ?????? ????????.  ????:  ??? ?????: ??? ????? ?? ??? ???? ????? ????? ??? ?????? ???? ???????????.  - ???? ?????? ??? ???? ??????????? ?????? ?? ???????? ?? ???? ???? ?????.  ?????? ??? ???? ???????: ??? ?????? ????????? ???? ????? ????? ?????? ??? ????? ????? ???? ?? ????? ???????. - ???? ??????? ??? ?????????   5 ?? ??????????? 20 ?????? ?????? ?????? ????????. - ???? ???????? ??? ????? ?????? ??????? ?????? ?????? ????.  ???? ?????? ??? ????? ????.  ?. ????  VISIT SUMMARY: Today, we addressed your recurrent abdominal pain, heart fluttering symptoms, and hypertension management. We made some adjustments to your medications to better manage your symptoms and scheduled a follow-up appointment.  YOUR PLAN: ABDOMINAL PAIN: You have been experiencing recurrent abdominal pain that returned after you finished your course of pantoprazole . -We have refilled your prescription for pantoprazole  to help manage your abdominal pain.  PRIMARY HYPERTENSION: You have been using amlodipine  intermittently for your high blood pressure, but it has caused fatigue and discomfort. -We have switched you to amlodipine  5 with olmesartan  20 every day to reduce side effects. -Please follow up in one week for re-evaluation and blood work.  Please let me know if you have any other questions.  Dr. Tharon

## 2023-12-20 NOTE — Progress Notes (Signed)
   SUBJECTIVE:   CHIEF COMPLAINT / HPI:  Discussed the use of AI scribe software for clinical note transcription with the patient, who gave verbal consent to proceed.  History of Present Illness Shawn Stark is a 38 year old male with hypertension who presents with recurrent abdominal pain.  Abdominal pain and gastrointestinal symptoms, GERD - Recurrent abdominal pain, returned after completing a course of pantoprazole . - Pantoprazole  previously alleviated abdominal pain and heart fluttering symptoms. - Seeing GI. CT with fatty liver and liver lesion for which MRI was obtained and demonstrates likely hepatic adenoma though recommends follow-up MRI with Gadavist/Veuway (not Eovist ) around March 2026.  Palpitations - Zio patch in July 2025 very reassuring and only with rare supra- and ventricular ectopy. Symptom episodes corresponded with sinus rhythm. - Heart fluttering symptoms improved with pantoprazole  treatment.  Hypertension and antihypertensive medication use - Uses amlodipine  10 mg intermittently for blood pressure management. - Takes amlodipine  when feeling 'feverish' or experiencing head pain. - 10 mg dose of amlodipine  causes fatigue and discomfort, perceived as excessive.  OBJECTIVE:  BP 131/82 (BP Location: Left Arm, Patient Position: Sitting, Cuff Size: Normal)   Pulse 72   Ht 5' 11 (1.803 m)   Wt 202 lb (91.6 kg)   SpO2 99%   BMI 28.17 kg/m   Physical Exam GENERAL: Alert, cooperative, well developed, no acute distress CHEST: Breathing and speaking comfortably on RA EXTREMITIES: No cyanosis or edema NEUROLOGICAL: Cranial nerves grossly intact, Moves all extremities without gross motor or sensory deficit  ASSESSMENT/PLAN:   Assessment & Plan Primary hypertension Controlled, though amlodipine  caused fatigue and discomfort, leading to intermittent use. - Switched to amlodipine -olmesartan  5-20 mg daily from 10 mg of amlodipine  alone - Scheduled follow-up in one  week for re-evaluation and blood work. Gastroesophageal reflux disease with esophagitis without hemorrhage Controlled with PPI and H2B, though he does not have this anymore. Unlikely intestinal infection given resolution with medication and presumptive treatment before [place] in the [place]  in [place]. - Pantoprazole  and famotidine  refilled - Continue to follow with GI Palpitations Likely sensation due to GERD given improvement with PPI. Zio reassuring. - Continue PPI - Follow up if recurs  Stuart Redo, MD [facility]

## 2023-12-20 NOTE — Assessment & Plan Note (Signed)
 Likely sensation due to GERD given improvement with PPI. Zio reassuring. - Continue PPI - Follow up if recurs

## 2023-12-25 ENCOUNTER — Ambulatory Visit: Payer: Self-pay | Admitting: Family Medicine

## 2023-12-25 ENCOUNTER — Other Ambulatory Visit: Payer: Self-pay | Admitting: Family Medicine

## 2023-12-25 VITALS — BP 133/83 | HR 80 | Wt 200.4 lb

## 2023-12-25 DIAGNOSIS — I10 Essential (primary) hypertension: Secondary | ICD-10-CM | POA: Diagnosis not present

## 2023-12-25 MED ORDER — AMLODIPINE-OLMESARTAN 10-20 MG PO TABS: 1.0000 | ORAL_TABLET | Freq: Every day | ORAL | 1 refills | Status: DC

## 2023-12-25 MED ORDER — BLOOD PRESSURE KIT: 1.0000 | PACK | Freq: Every day | 0 refills | Status: AC

## 2023-12-25 NOTE — Patient Instructions (Addendum)
 Your blood pressure remains elevated but is improved today. I have sent in a higher dose of medication. STOP the Azor  5-20 and START the Azor  10-20. Take one tablet daily. Try to take blood pressure readings at home with the monitor I sent for you. Come back in 1 month or sooner if needed.  ?? ???? ??? ??? ???????? ????? ????? ?????. ?????? ?? ???? ???? ?? ??????. ???? ????? Azor  5-20? ????? ?????? Azor  10-20. ????? ????? ?????? ??????. ???? ???? ??? ???? ?? ?????? ???????? ???? ???? ??? ???? ???? ?????? ??. ??? ????? ??? ??? ?? ??? ??? ??? ?????.

## 2023-12-25 NOTE — Progress Notes (Signed)
    SUBJECTIVE:   CHIEF COMPLAINT / HPI:   BP check Has been taking Azor  5-20 mg daily without issue.  OBJECTIVE:   BP 133/83   Pulse 80   Wt 200 lb 6.4 oz (90.9 kg)   SpO2 94%   BMI 27.95 kg/m   General: Alert and oriented, in NAD Skin: Warm, dry, and intact without lesions HEENT: NCAT, EOM grossly normal, midline nasal septum Respiratory: Breathing and speaking comfortably on RA Extremities: Moves all extremities grossly equally Neurological: No gross focal deficit Psychiatric: Appropriate mood and affect   ASSESSMENT/PLAN:   Assessment & Plan Primary hypertension Better controlled today. Given his age and tolerability, will try to target <130/80. Increase to Azor  10-20 mg daily. He will take his BP at home and send me readings over MyChart (he has someone who can help him create his account). He knows to stop the medicine and let me know if having symptoms of lightheadedness or dizziness with this dose. Will update BMP today. Return in 1 month.  Stuart Redo, MD [facility]

## 2023-12-25 NOTE — Assessment & Plan Note (Signed)
 Better controlled today. Given his age and tolerability, will try to target <130/80. Increase to Azor  10-20 mg daily. He will take his BP at home and send me readings over MyChart (he has someone who can help him create his account). He knows to stop the medicine and let me know if having symptoms of lightheadedness or dizziness with this dose. Will update BMP today. Return in 1 month.

## 2023-12-26 ENCOUNTER — Ambulatory Visit: Payer: Self-pay | Admitting: Family Medicine

## 2023-12-26 LAB — BASIC METABOLIC PANEL WITH GFR
BUN/Creatinine Ratio: 16 (ref 9–20)
BUN: 14 mg/dL (ref 6–20)
CO2: 22 mmol/L (ref 20–29)
Calcium: 9.7 mg/dL (ref 8.7–10.2)
Chloride: 105 mmol/L (ref 96–106)
Creatinine, Ser: 0.89 mg/dL (ref 0.76–1.27)
Glucose: 103 mg/dL — ABNORMAL HIGH (ref 70–99)
Potassium: 4.1 mmol/L (ref 3.5–5.2)
Sodium: 139 mmol/L (ref 134–144)
eGFR: 113 mL/min/1.73 (ref >59)

## 2024-02-07 ENCOUNTER — Encounter: Payer: Self-pay | Admitting: Internal Medicine

## 2024-02-15 ENCOUNTER — Other Ambulatory Visit: Payer: Self-pay | Admitting: Family Medicine

## 2024-02-18 ENCOUNTER — Ambulatory Visit: Admitting: Family Medicine

## 2024-02-18 ENCOUNTER — Ambulatory Visit: Payer: Self-pay | Admitting: Family Medicine

## 2024-02-18 ENCOUNTER — Encounter: Payer: Self-pay | Admitting: Family Medicine

## 2024-02-18 VITALS — BP 128/84 | HR 88 | Ht 71.0 in | Wt 210.0 lb

## 2024-02-18 DIAGNOSIS — Z23 Encounter for immunization: Secondary | ICD-10-CM | POA: Diagnosis not present

## 2024-02-18 DIAGNOSIS — R0609 Other forms of dyspnea: Secondary | ICD-10-CM | POA: Insufficient documentation

## 2024-02-18 DIAGNOSIS — K769 Liver disease, unspecified: Secondary | ICD-10-CM | POA: Diagnosis not present

## 2024-02-18 DIAGNOSIS — I10 Essential (primary) hypertension: Secondary | ICD-10-CM

## 2024-02-18 LAB — POCT GLYCOSYLATED HEMOGLOBIN (HGB A1C): Hemoglobin A1C: 5.3 % (ref 4.0–5.6)

## 2024-02-18 NOTE — Assessment & Plan Note (Signed)
 Shortness of breath with minimal exertion. Cardiac causes considered, low suspicion. Further evaluation needed. Prior EKG reassuring. No current chest pain or SOB. - Referred to cardiology for stress test. - Ordered lipid panel and A1c for further risk stratification.  - Advised ER visit if symptoms worsen before cardiology visit.

## 2024-02-18 NOTE — Progress Notes (Signed)
" ° °  SUBJECTIVE:   CHIEF COMPLAINT / HPI:  Discussed the use of AI scribe software for clinical note transcription with the patient, who gave verbal consent to proceed.  History of Present Illness Shawn Stark is a 39 year old male who presents with concerns of high blood pressure and shortness of breath.  Hypertension - Elevated blood pressure readings recently at home to 150s/100s. - Concerned about blood pressure control - Takes Azor  10-20 mg daily  Dyspnea - Progressive shortness of breath with minimal exertion, such as pulling objects or putting on socks - Dyspnea sometimes occurs while lying down - Shortness of breath is diffuse across the chest - No associated chest pain with dyspnea - Sweating with minimal activity  Chest pain, GERD - Focal chest pain separate from shortness of breath - Pain improved with Pepcid  and Protonix   History of liver abnormality - Abdominal MRI previously showed likely hepatic adenoma with recommended re-imaging in 6 months - No current abdominal symptoms   OBJECTIVE:  BP 128/84   Pulse 88   Ht 5' 11 (1.803 m)   Wt 210 lb (95.3 kg)   SpO2 98%   BMI 29.29 kg/m   Physical Exam GENERAL: Alert, cooperative, well developed, no acute distress CHEST: Clear to auscultation bilaterally, no wheezes, rhonchi, or crackles, no chest wall tenderness CARDIOVASCULAR: Normal heart rate and rhythm, S1 and S2 normal without murmurs, no TTP of chest wall NEUROLOGICAL: Cranial nerves grossly intact, moves all extremities without gross motor or sensory deficit  ASSESSMENT/PLAN:   Assessment & Plan Primary hypertension Overall better controlled on current regimen. Will continue Azor  10-20 mg daily. Update BMP today. Dyspnea on exertion Shortness of breath with minimal exertion. Cardiac causes considered, low suspicion. Further evaluation needed. Prior EKG reassuring. No current chest pain or SOB. - Referred to cardiology for stress test. - Ordered lipid  panel and A1c for further risk stratification.  - Advised ER visit if symptoms worsen before cardiology visit. Encounter for immunization Flu vaccine given. Liver lesion Most likely hepatic adenoma on MRI abdomen. Doing well today on exam without pain. He will follow up with GI about this.  Stuart Redo, MD [facility]  "

## 2024-02-18 NOTE — Assessment & Plan Note (Signed)
 Overall better controlled on current regimen. Will continue Azor  10-20 mg daily. Update BMP today.

## 2024-02-18 NOTE — Assessment & Plan Note (Signed)
 Most likely hepatic adenoma on MRI abdomen. Doing well today on exam without pain. He will follow up with GI about this.

## 2024-02-18 NOTE — Patient Instructions (Addendum)
??? ????? ??? ??? ????? ????? ?????? ?????? ??? ?????? ???? ???? ????? ???? ???? ??? ?????? ??? ???? ??? ?? ??? ??? ?????? ????. ??? ?? ??? ????? ?? ??? ????? ?????? ??? ????????. ????? ????????? ?? ????? ????? ??? ???? ???????? ??. ° °  I have referred you to cardiology to possibly do a stress test to better look into your heart function in case this is causing some shortness of breath. We also collected labs today. Continue your blood pressure medications.

## 2024-02-19 ENCOUNTER — Ambulatory Visit: Payer: Self-pay | Admitting: Family Medicine

## 2024-02-19 LAB — LIPID PANEL
Chol/HDL Ratio: 4.4 ratio (ref 0.0–5.0)
Cholesterol, Total: 148 mg/dL (ref 100–199)
HDL: 34 mg/dL — ABNORMAL LOW
LDL Chol Calc (NIH): 57 mg/dL (ref 0–99)
Triglycerides: 373 mg/dL — ABNORMAL HIGH (ref 0–149)
VLDL Cholesterol Cal: 57 mg/dL — ABNORMAL HIGH (ref 5–40)

## 2024-02-19 LAB — BASIC METABOLIC PANEL WITH GFR
BUN/Creatinine Ratio: 19 (ref 9–20)
BUN: 15 mg/dL (ref 6–20)
CO2: 20 mmol/L (ref 20–29)
Calcium: 9.5 mg/dL (ref 8.7–10.2)
Chloride: 103 mmol/L (ref 96–106)
Creatinine, Ser: 0.77 mg/dL (ref 0.76–1.27)
Glucose: 94 mg/dL (ref 70–99)
Potassium: 4 mmol/L (ref 3.5–5.2)
Sodium: 137 mmol/L (ref 134–144)
eGFR: 118 mL/min/1.73

## 2024-03-03 ENCOUNTER — Encounter: Payer: Self-pay | Admitting: Cardiology

## 2024-03-03 ENCOUNTER — Ambulatory Visit

## 2024-03-03 ENCOUNTER — Other Ambulatory Visit (HOSPITAL_COMMUNITY): Payer: Self-pay

## 2024-03-03 ENCOUNTER — Ambulatory Visit: Admitting: Cardiology

## 2024-03-03 VITALS — BP 144/88 | HR 74 | Ht 75.0 in | Wt 201.6 lb

## 2024-03-03 DIAGNOSIS — I10 Essential (primary) hypertension: Secondary | ICD-10-CM | POA: Diagnosis not present

## 2024-03-03 DIAGNOSIS — R002 Palpitations: Secondary | ICD-10-CM

## 2024-03-03 DIAGNOSIS — R0609 Other forms of dyspnea: Secondary | ICD-10-CM | POA: Diagnosis not present

## 2024-03-03 DIAGNOSIS — E782 Mixed hyperlipidemia: Secondary | ICD-10-CM | POA: Insufficient documentation

## 2024-03-03 MED ORDER — METOPROLOL TARTRATE 25 MG PO TABS
25.0000 mg | ORAL_TABLET | Freq: Two times a day (BID) | ORAL | 3 refills | Status: AC
  Filled 2024-03-03: qty 180, 90d supply, fill #0

## 2024-03-03 NOTE — Progress Notes (Unsigned)
 Enrolled patient for a 14 day Zio XT  monitor to be mailed to patients home

## 2024-03-10 ENCOUNTER — Ambulatory Visit (HOSPITAL_COMMUNITY)

## 2024-04-10 ENCOUNTER — Ambulatory Visit (HOSPITAL_COMMUNITY)

## 2024-04-10 ENCOUNTER — Encounter (HOSPITAL_COMMUNITY)

## 2024-04-28 ENCOUNTER — Ambulatory Visit: Admitting: Cardiology
